# Patient Record
Sex: Female | Born: 1955 | Race: White | Hispanic: No | Marital: Single | Smoking: Former smoker
Health system: Southern US, Community
[De-identification: ages and names within clinical notes are randomized; demographics above are authoritative.]

## PROBLEM LIST (undated history)

## (undated) DIAGNOSIS — F32A Depression, unspecified: Secondary | ICD-10-CM

## (undated) DIAGNOSIS — F329 Major depressive disorder, single episode, unspecified: Secondary | ICD-10-CM

## (undated) DIAGNOSIS — M1990 Unspecified osteoarthritis, unspecified site: Secondary | ICD-10-CM

## (undated) DIAGNOSIS — F17200 Nicotine dependence, unspecified, uncomplicated: Secondary | ICD-10-CM

## (undated) DIAGNOSIS — E785 Hyperlipidemia, unspecified: Secondary | ICD-10-CM

## (undated) HISTORY — DX: Major depressive disorder, single episode, unspecified: F32.9

## (undated) HISTORY — PX: DENTAL SURGERY: SHX609

## (undated) HISTORY — DX: Depression, unspecified: F32.A

## (undated) HISTORY — DX: Unspecified osteoarthritis, unspecified site: M19.90

## (undated) HISTORY — DX: Hyperlipidemia, unspecified: E78.5

## (undated) HISTORY — PX: COLONOSCOPY: SHX174

## (undated) HISTORY — DX: Nicotine dependence, unspecified, uncomplicated: F17.200

---

## 1998-06-20 ENCOUNTER — Other Ambulatory Visit: Admission: RE | Admit: 1998-06-20 | Discharge: 1998-06-20 | Payer: Self-pay | Admitting: Obstetrics and Gynecology

## 1999-06-28 ENCOUNTER — Other Ambulatory Visit: Admission: RE | Admit: 1999-06-28 | Discharge: 1999-06-28 | Payer: Self-pay | Admitting: Obstetrics and Gynecology

## 2000-07-22 ENCOUNTER — Other Ambulatory Visit: Admission: RE | Admit: 2000-07-22 | Discharge: 2000-07-22 | Payer: Self-pay | Admitting: *Deleted

## 2002-08-10 ENCOUNTER — Other Ambulatory Visit: Admission: RE | Admit: 2002-08-10 | Discharge: 2002-08-10 | Payer: Self-pay | Admitting: Gynecology

## 2003-05-25 ENCOUNTER — Ambulatory Visit (HOSPITAL_COMMUNITY): Admission: RE | Admit: 2003-05-25 | Discharge: 2003-05-25 | Payer: Self-pay | Admitting: Internal Medicine

## 2003-08-27 ENCOUNTER — Other Ambulatory Visit: Admission: RE | Admit: 2003-08-27 | Discharge: 2003-08-27 | Payer: Self-pay | Admitting: Gynecology

## 2004-05-23 ENCOUNTER — Ambulatory Visit: Payer: Self-pay | Admitting: Internal Medicine

## 2004-06-08 ENCOUNTER — Ambulatory Visit (HOSPITAL_COMMUNITY): Admission: RE | Admit: 2004-06-08 | Discharge: 2004-06-08 | Payer: Self-pay | Admitting: Internal Medicine

## 2004-06-14 ENCOUNTER — Ambulatory Visit: Payer: Self-pay | Admitting: Internal Medicine

## 2004-07-17 ENCOUNTER — Ambulatory Visit: Payer: Self-pay | Admitting: Internal Medicine

## 2004-08-31 ENCOUNTER — Other Ambulatory Visit: Admission: RE | Admit: 2004-08-31 | Discharge: 2004-08-31 | Payer: Self-pay | Admitting: Gynecology

## 2004-09-18 ENCOUNTER — Ambulatory Visit: Payer: Self-pay | Admitting: Internal Medicine

## 2005-01-22 ENCOUNTER — Ambulatory Visit: Payer: Self-pay | Admitting: Internal Medicine

## 2005-04-12 ENCOUNTER — Ambulatory Visit: Payer: Self-pay | Admitting: Internal Medicine

## 2005-07-17 ENCOUNTER — Ambulatory Visit (HOSPITAL_COMMUNITY): Admission: RE | Admit: 2005-07-17 | Discharge: 2005-07-17 | Payer: Self-pay | Admitting: Internal Medicine

## 2005-08-03 ENCOUNTER — Ambulatory Visit: Payer: Self-pay | Admitting: Internal Medicine

## 2005-08-10 ENCOUNTER — Ambulatory Visit: Payer: Self-pay | Admitting: Internal Medicine

## 2005-08-16 ENCOUNTER — Ambulatory Visit: Payer: Self-pay | Admitting: Internal Medicine

## 2005-09-06 ENCOUNTER — Other Ambulatory Visit: Admission: RE | Admit: 2005-09-06 | Discharge: 2005-09-06 | Payer: Self-pay | Admitting: Gynecology

## 2005-12-14 ENCOUNTER — Ambulatory Visit: Payer: Self-pay | Admitting: Internal Medicine

## 2005-12-31 ENCOUNTER — Ambulatory Visit: Payer: Self-pay | Admitting: Internal Medicine

## 2006-07-31 ENCOUNTER — Ambulatory Visit: Payer: Self-pay | Admitting: Internal Medicine

## 2006-07-31 LAB — CONVERTED CEMR LAB
ALT: 13 units/L (ref 0–40)
Alkaline Phosphatase: 89 units/L (ref 39–117)
Direct LDL: 168.3 mg/dL
Eosinophils Absolute: 0.4 10*3/uL (ref 0.0–0.6)
GFR calc Af Amer: 114 mL/min
Glucose, Bld: 81 mg/dL (ref 70–99)
HCT: 40.1 % (ref 36.0–46.0)
HDL: 46.4 mg/dL (ref >39.0)
Lymphocytes Relative: 40.2 % (ref 12.0–46.0)
MCHC: 34.2 g/dL (ref 30.0–36.0)
MCV: 97 fL (ref 78.0–100.0)
Monocytes Relative: 10.9 % (ref 3.0–11.0)
Neutro Abs: 2.5 10*3/uL (ref 1.4–7.7)
Neutrophils Relative %: 42.2 % — ABNORMAL LOW (ref 43.0–77.0)
Platelets: 320 10*3/uL (ref 150–400)
Potassium: 4.3 meq/L (ref 3.5–5.1)
Total Bilirubin: 0.7 mg/dL (ref 0.3–1.2)
Total CHOL/HDL Ratio: 5
Total Protein: 7.4 g/dL (ref 6.0–8.3)
Triglycerides: 78 mg/dL (ref 0–149)
VLDL: 16 mg/dL (ref 0–40)

## 2006-08-06 ENCOUNTER — Ambulatory Visit: Payer: Self-pay | Admitting: Internal Medicine

## 2006-08-13 ENCOUNTER — Encounter: Payer: Self-pay | Admitting: Internal Medicine

## 2006-08-13 ENCOUNTER — Ambulatory Visit: Payer: Self-pay | Admitting: Internal Medicine

## 2006-09-11 ENCOUNTER — Other Ambulatory Visit: Admission: RE | Admit: 2006-09-11 | Discharge: 2006-09-11 | Payer: Self-pay | Admitting: Gynecology

## 2006-12-05 ENCOUNTER — Ambulatory Visit: Payer: Self-pay | Admitting: Internal Medicine

## 2006-12-05 DIAGNOSIS — Z872 Personal history of diseases of the skin and subcutaneous tissue: Secondary | ICD-10-CM | POA: Insufficient documentation

## 2006-12-05 DIAGNOSIS — F325 Major depressive disorder, single episode, in full remission: Secondary | ICD-10-CM | POA: Insufficient documentation

## 2006-12-05 DIAGNOSIS — F3341 Major depressive disorder, recurrent, in partial remission: Secondary | ICD-10-CM | POA: Insufficient documentation

## 2006-12-19 ENCOUNTER — Telehealth: Payer: Self-pay | Admitting: Internal Medicine

## 2007-01-01 LAB — CONVERTED CEMR LAB: Pap Smear: NORMAL

## 2007-01-07 ENCOUNTER — Telehealth: Payer: Self-pay | Admitting: Internal Medicine

## 2007-01-09 ENCOUNTER — Ambulatory Visit: Payer: Self-pay | Admitting: Internal Medicine

## 2007-01-09 DIAGNOSIS — J309 Allergic rhinitis, unspecified: Secondary | ICD-10-CM | POA: Insufficient documentation

## 2007-01-16 ENCOUNTER — Telehealth (INDEPENDENT_AMBULATORY_CARE_PROVIDER_SITE_OTHER): Payer: Self-pay | Admitting: *Deleted

## 2007-01-30 ENCOUNTER — Telehealth (INDEPENDENT_AMBULATORY_CARE_PROVIDER_SITE_OTHER): Payer: Self-pay | Admitting: *Deleted

## 2007-02-06 ENCOUNTER — Ambulatory Visit: Payer: Self-pay | Admitting: Internal Medicine

## 2007-02-06 LAB — CONVERTED CEMR LAB
Cholesterol: 216 mg/dL (ref 0–200)
Direct LDL: 170.6 mg/dL
Triglycerides: 65 mg/dL (ref 0–149)
VLDL: 13 mg/dL (ref 0–40)

## 2007-02-12 ENCOUNTER — Ambulatory Visit: Payer: Self-pay | Admitting: Internal Medicine

## 2007-02-12 DIAGNOSIS — E785 Hyperlipidemia, unspecified: Secondary | ICD-10-CM | POA: Insufficient documentation

## 2007-02-12 LAB — CONVERTED CEMR LAB
Cholesterol, target level: 200 mg/dL
HDL goal, serum: 40 mg/dL

## 2007-07-22 ENCOUNTER — Telehealth: Payer: Self-pay | Admitting: Internal Medicine

## 2007-08-21 ENCOUNTER — Ambulatory Visit (HOSPITAL_COMMUNITY): Admission: RE | Admit: 2007-08-21 | Discharge: 2007-08-21 | Payer: Self-pay | Admitting: Internal Medicine

## 2007-08-21 ENCOUNTER — Ambulatory Visit: Payer: Self-pay | Admitting: Internal Medicine

## 2007-08-21 LAB — CONVERTED CEMR LAB
Bilirubin, Direct: 0.1 mg/dL (ref 0.0–0.3)
Cholesterol: 171 mg/dL (ref 0–200)
LDL Cholesterol: 122 mg/dL — ABNORMAL HIGH (ref 0–99)
Total Bilirubin: 0.6 mg/dL (ref 0.3–1.2)
Total CHOL/HDL Ratio: 4.8
Triglycerides: 66 mg/dL (ref 0–149)

## 2007-09-25 ENCOUNTER — Other Ambulatory Visit: Admission: RE | Admit: 2007-09-25 | Discharge: 2007-09-25 | Payer: Self-pay | Admitting: Gynecology

## 2008-02-04 ENCOUNTER — Ambulatory Visit: Payer: Self-pay | Admitting: Internal Medicine

## 2008-02-04 LAB — CONVERTED CEMR LAB
AST: 19 units/L (ref 0–37)
Albumin: 4 g/dL (ref 3.5–5.2)
Alkaline Phosphatase: 78 units/L (ref 39–117)
BUN: 14 mg/dL (ref 6–23)
Cholesterol: 214 mg/dL (ref 0–200)
Eosinophils Absolute: 0.6 10*3/uL (ref 0.0–0.7)
Eosinophils Relative: 10.2 % — ABNORMAL HIGH (ref 0.0–5.0)
Glucose, Bld: 88 mg/dL (ref 70–99)
Hemoglobin: 15 g/dL (ref 12.0–15.0)
Lymphocytes Relative: 43.5 % (ref 12.0–46.0)
MCHC: 34.3 g/dL (ref 30.0–36.0)
Monocytes Absolute: 0.5 10*3/uL (ref 0.1–1.0)
Monocytes Relative: 8.6 % (ref 3.0–12.0)
Neutro Abs: 2 10*3/uL (ref 1.4–7.7)
Neutrophils Relative %: 37.3 % — ABNORMAL LOW (ref 43.0–77.0)
Platelets: 296 10*3/uL (ref 150–400)
RBC: 4.33 M/uL (ref 3.87–5.11)
TSH: 1.58 microintl units/mL (ref 0.35–5.50)
Total CHOL/HDL Ratio: 4.7
Triglycerides: 73 mg/dL (ref 0–149)
pH: 7

## 2008-02-11 ENCOUNTER — Ambulatory Visit: Payer: Self-pay | Admitting: Internal Medicine

## 2008-09-27 ENCOUNTER — Ambulatory Visit: Payer: Self-pay | Admitting: Women's Health

## 2008-09-27 ENCOUNTER — Other Ambulatory Visit: Admission: RE | Admit: 2008-09-27 | Discharge: 2008-09-27 | Payer: Self-pay | Admitting: Obstetrics and Gynecology

## 2008-09-27 ENCOUNTER — Encounter: Payer: Self-pay | Admitting: Women's Health

## 2008-09-27 ENCOUNTER — Encounter: Payer: Self-pay | Admitting: Internal Medicine

## 2008-09-30 LAB — CONVERTED CEMR LAB: Pap Smear: NORMAL

## 2008-10-25 ENCOUNTER — Emergency Department (HOSPITAL_COMMUNITY): Admission: EM | Admit: 2008-10-25 | Discharge: 2008-10-25 | Payer: Self-pay | Admitting: Family Medicine

## 2009-03-18 ENCOUNTER — Ambulatory Visit (HOSPITAL_COMMUNITY): Admission: RE | Admit: 2009-03-18 | Discharge: 2009-03-18 | Payer: Self-pay | Admitting: Internal Medicine

## 2009-04-07 ENCOUNTER — Ambulatory Visit: Payer: Self-pay | Admitting: Internal Medicine

## 2009-04-07 LAB — CONVERTED CEMR LAB
ALT: 14 units/L (ref 0–35)
BUN: 15 mg/dL (ref 6–23)
CO2: 30 meq/L (ref 19–32)
Calcium: 9.2 mg/dL (ref 8.4–10.5)
Cholesterol: 194 mg/dL (ref 0–200)
Eosinophils Absolute: 0.3 10*3/uL (ref 0.0–0.7)
Eosinophils Relative: 4.9 % (ref 0.0–5.0)
GFR calc non Af Amer: 93.5 mL/min (ref >60)
HDL: 52.4 mg/dL (ref >39.00)
Hemoglobin: 13.7 g/dL (ref 12.0–15.0)
Ketones, ur: NEGATIVE mg/dL
MCHC: 34.4 g/dL (ref 30.0–36.0)
MCV: 99.6 fL (ref 78.0–100.0)
Monocytes Relative: 8.9 % (ref 3.0–12.0)
Platelets: 255 10*3/uL (ref 150.0–400.0)
RBC: 4.02 M/uL (ref 3.87–5.11)
Specific Gravity, Urine: 1.025 (ref 1.000–1.030)
TSH: 1.7 microintl units/mL (ref 0.35–5.50)
Total CHOL/HDL Ratio: 4
Total Protein, Urine: NEGATIVE mg/dL
Triglycerides: 65 mg/dL (ref 0.0–149.0)
Urine Glucose: NEGATIVE mg/dL
Urobilinogen, UA: 0.2 (ref 0.0–1.0)
VLDL: 13 mg/dL (ref 0.0–40.0)
WBC: 6.6 10*3/uL (ref 4.5–10.5)
pH: 5.5 (ref 5.0–8.0)

## 2009-04-11 ENCOUNTER — Ambulatory Visit: Payer: Self-pay | Admitting: Internal Medicine

## 2009-08-08 ENCOUNTER — Telehealth: Payer: Self-pay | Admitting: Internal Medicine

## 2009-09-30 ENCOUNTER — Encounter: Payer: Self-pay | Admitting: Internal Medicine

## 2009-09-30 ENCOUNTER — Ambulatory Visit: Payer: Self-pay | Admitting: Women's Health

## 2009-09-30 ENCOUNTER — Other Ambulatory Visit: Admission: RE | Admit: 2009-09-30 | Discharge: 2009-09-30 | Payer: Self-pay | Admitting: Obstetrics and Gynecology

## 2009-10-28 LAB — CONVERTED CEMR LAB: Pap Smear: NORMAL

## 2010-04-10 ENCOUNTER — Other Ambulatory Visit: Payer: Self-pay | Admitting: Internal Medicine

## 2010-04-10 ENCOUNTER — Ambulatory Visit
Admission: RE | Admit: 2010-04-10 | Discharge: 2010-04-10 | Payer: Self-pay | Source: Home / Self Care | Attending: Internal Medicine | Admitting: Internal Medicine

## 2010-04-10 LAB — LIPID PANEL
Cholesterol: 209 mg/dL — ABNORMAL HIGH (ref 0–200)
HDL: 45.9 mg/dL (ref >39.00)
Total CHOL/HDL Ratio: 5
Triglycerides: 81 mg/dL (ref 0.0–149.0)
VLDL: 16.2 mg/dL (ref 0.0–40.0)

## 2010-04-10 LAB — CBC WITH DIFFERENTIAL/PLATELET
Basophils Absolute: 0 10*3/uL (ref 0.0–0.1)
Basophils Relative: 0.5 % (ref 0.0–3.0)
Eosinophils Absolute: 0.3 10*3/uL (ref 0.0–0.7)
Eosinophils Relative: 3.9 % (ref 0.0–5.0)
HCT: 42.7 % (ref 36.0–46.0)
Hemoglobin: 14.6 g/dL (ref 12.0–15.0)
Lymphocytes Relative: 39.2 % (ref 12.0–46.0)
Lymphs Abs: 2.7 10*3/uL (ref 0.7–4.0)
MCHC: 34.3 g/dL (ref 30.0–36.0)
MCV: 99.8 fl (ref 78.0–100.0)
Monocytes Absolute: 0.7 10*3/uL (ref 0.1–1.0)
Monocytes Relative: 10 % (ref 3.0–12.0)
Neutro Abs: 3.2 10*3/uL (ref 1.4–7.7)
Neutrophils Relative %: 46.4 % (ref 43.0–77.0)
Platelets: 275 10*3/uL (ref 150.0–400.0)
RBC: 4.28 Mil/uL (ref 3.87–5.11)
RDW: 14.2 % (ref 11.5–14.6)
WBC: 6.9 10*3/uL (ref 4.5–10.5)

## 2010-04-10 LAB — HEPATIC FUNCTION PANEL
ALT: 12 U/L (ref 0–35)
AST: 14 U/L (ref 0–37)
Albumin: 4 g/dL (ref 3.5–5.2)
Alkaline Phosphatase: 69 U/L (ref 39–117)
Bilirubin, Direct: 0.1 mg/dL (ref 0.0–0.3)
Total Bilirubin: 0.9 mg/dL (ref 0.3–1.2)
Total Protein: 7.2 g/dL (ref 6.0–8.3)

## 2010-04-10 LAB — BASIC METABOLIC PANEL
BUN: 15 mg/dL (ref 6–23)
CO2: 31 mEq/L (ref 19–32)
Calcium: 9.2 mg/dL (ref 8.4–10.5)
Chloride: 102 mEq/L (ref 96–112)
Creatinine, Ser: 0.6 mg/dL (ref 0.4–1.2)
GFR: 105.18 mL/min (ref >60.00)
Glucose, Bld: 81 mg/dL (ref 70–99)
Potassium: 4.4 mEq/L (ref 3.5–5.1)
Sodium: 138 mEq/L (ref 135–145)

## 2010-04-10 LAB — URINALYSIS
Bilirubin Urine: NEGATIVE
Hemoglobin, Urine: NEGATIVE
Ketones, ur: NEGATIVE
Leukocytes, UA: NEGATIVE
Nitrite: NEGATIVE
Specific Gravity, Urine: 1.02 (ref 1.000–1.030)
Total Protein, Urine: NEGATIVE
Urine Glucose: NEGATIVE
Urobilinogen, UA: 0.2 (ref 0.0–1.0)
pH: 6.5 (ref 5.0–8.0)

## 2010-04-10 LAB — LDL CHOLESTEROL, DIRECT: Direct LDL: 157.3 mg/dL

## 2010-04-10 LAB — TSH: TSH: 1.97 u[IU]/mL (ref 0.35–5.50)

## 2010-04-12 ENCOUNTER — Telehealth: Payer: Self-pay | Admitting: Internal Medicine

## 2010-04-12 ENCOUNTER — Ambulatory Visit
Admission: RE | Admit: 2010-04-12 | Discharge: 2010-04-12 | Payer: Self-pay | Source: Home / Self Care | Attending: Internal Medicine | Admitting: Internal Medicine

## 2010-04-21 ENCOUNTER — Ambulatory Visit (HOSPITAL_COMMUNITY)
Admission: RE | Admit: 2010-04-21 | Discharge: 2010-04-21 | Payer: Self-pay | Source: Home / Self Care | Attending: Internal Medicine | Admitting: Internal Medicine

## 2010-05-04 NOTE — Assessment & Plan Note (Signed)
Summary: CPX / RS   Vital Signs:  Patient profile:   55 year old female Height:      66 inches Weight:      154 pounds BMI:     24.95 Temp:     98.2 degrees F oral Pulse rate:   72 / minute Resp:     14 per minute BP sitting:   124 / 80  (left arm)  Vitals Entered By: Willy Eddy, LPN (April 11, 2009 3:20 PM) CC: cpx   CC:  cpx.  History of Present Illness: The pt was asked about all immunizations, health maint. services that are appropriate to their age and was given guidance on diet exercize  and weight management   Preventive Screening-Counseling & Management  Alcohol-Tobacco     Smoking Status: quit     Year Quit: 1998     Passive Smoke Exposure: no  Problems Prior to Update: 1)  Preventive Health Care  (ICD-V70.0) 2)  Hyperlipidemia  (ICD-272.4) 3)  Family History Diabetes 1st Degree Relative  (ICD-V18.0) 4)  Depression  (ICD-311) 5)  Allergic Rhinitis  (ICD-477.9) 6)  Contact Dermatitis, Hx of  (ICD-V13.3) 7)  Depression, Chronic  (ICD-311)  Medications Prior to Update: 1)  Cymbalta 60 Mg  Cpep (Duloxetine Hcl) .... Once Daily 2)  Niacin Flush Free 500 Mg  Caps (Inositol Niacinate) .... Once Daily 3)  Pravastatin Sodium 40 Mg Tabs (Pravastatin Sodium) .... One By Mouth Q Hs 4)  Loratadine 10 Mg Tabs (Loratadine) .... One By Mouth Daily  Current Medications (verified): 1)  Cymbalta 60 Mg  Cpep (Duloxetine Hcl) .... Once Daily 2)  Pravastatin Sodium 40 Mg Tabs (Pravastatin Sodium) .... One By Mouth Q Hs  Allergies (verified): No Known Drug Allergies  Past History:  Family History: Last updated: 01/09/2007 mother has parkinson's father alive and well Family History Diabetes 1st degree relative family hs of depression  Social History: Last updated: 01/09/2007 Single Former Smoker  Risk Factors: Caffeine Use: 1 (02/12/2007) Exercise: no (02/12/2007)  Risk Factors: Smoking Status: quit (04/11/2009) Passive Smoke Exposure: no  (04/11/2009)  Past medical, surgical, family and social histories (including risk factors) reviewed, and no changes noted (except as noted below).  Past Medical History: Reviewed history from 02/12/2007 and no changes required. Allergic rhinitis Depression Hyperlipidemia  Past Surgical History: Reviewed history from 01/09/2007 and no changes required. Denies surgical history  Family History: Reviewed history from 01/09/2007 and no changes required. mother has parkinson's father alive and well Family History Diabetes 1st degree relative family hs of depression  Social History: Reviewed history from 01/09/2007 and no changes required. Single Former Smoker  Review of Systems  The patient denies anorexia, fever, weight loss, weight gain, vision loss, decreased hearing, hoarseness, chest pain, syncope, dyspnea on exertion, peripheral edema, prolonged cough, headaches, hemoptysis, abdominal pain, melena, hematochezia, severe indigestion/heartburn, hematuria, incontinence, genital sores, muscle weakness, suspicious skin lesions, transient blindness, difficulty walking, depression, unusual weight change, abnormal bleeding, enlarged lymph nodes, angioedema, breast masses, and testicular masses.         Flu Vaccine Consent Questions     Do you have a history of severe allergic reactions to this vaccine? no    Any prior history of allergic reactions to egg and/or gelatin? no    Do you have a sensitivity to the preservative Thimersol? no    Do you have a past history of Guillan-Barre Syndrome? no    Do you currently have an acute febrile illness? no  Have you ever had a severe reaction to latex? no    Vaccine information given and explained to patient? yes    Are you currently pregnant? no    Lot Number:[id number]   Exp Date:09/29/2009   Site Given  Left Deltoid IM   Physical Exam  General:  alert.  well-developed.   Head:  normocephalic and atraumatic.   Eyes:  pupils equal and  pupils round.   Ears:  R ear normal and L ear normal.   Nose:  External nasal examination shows no deformity or inflammation. Nasal mucosa are pink and moist without lesions or exudates. Mouth:  pharynx pink and moist.   Neck:  No deformities, masses, or tenderness noted. Lungs:  Normal respiratory effort, chest expands symmetrically. Lungs are clear to auscultation, no crackles or wheezes. Heart:  Normal rate and regular rhythm. S1 and S2 normal without gallop, murmur, click, rub or other extra sounds. Abdomen:  soft and normal bowel sounds.   Msk:  No deformity or scoliosis noted of thoracic or lumbar spine.   Pulses:  R and L carotid,radial,femoral,dorsalis pedis and posterior tibial pulses are full and equal bilaterally Extremities:  No clubbing, cyanosis, edema, or deformity noted with normal full range of motion of all joints.   Neurologic:  alert & oriented X3, gait normal, and DTRs symmetrical and normal.     Impression & Recommendations:  Problem # 1:  PREVENTIVE HEALTH CARE (ICD-V70.0) The pt was asked about all immunizations, health maint. services that are appropriate to their age and was given guidance on diet exercize  and weight management  Mammogram: ASSESSMENT: Negative - BI-RADS 1^MM DIGITAL SCREENING (03/18/2009) Pap smear: normal (09/30/2008) Colonoscopy: normal (11/30/2003) Bone Density: normal (08/13/2006) Td Booster: Historical (04/02/2005)   Flu Vax: Fluvax 3+ (04/11/2009)   Chol: 194 (04/07/2009)   HDL: 52.40 (04/07/2009)   LDL: 129 (04/07/2009)   TG: 65.0 (04/07/2009) TSH: 1.70 (04/07/2009)   Next mammogram due:: 04/2010 (04/11/2009) Next Colonoscopy due:: 12/2010 (02/11/2008) Next Bone Density due:: 07/2008 (02/11/2008)  Discussed using sunscreen, use of alcohol, drug use, self breast exam, routine dental care, routine eye care, schedule for GYN exam, routine physical exam, seat belts, multiple vitamins, osteoporosis prevention, adequate calcium intake in diet,  recommendations for immunizations, mammograms and Pap smears.  Discussed exercise and checking cholesterol.  Discussed gun safety, safe sex, and contraception.  Complete Medication List: 1)  Cymbalta 60 Mg Cpep (Duloxetine hcl) .... Once daily 2)  Pravastatin Sodium 40 Mg Tabs (Pravastatin sodium) .... One by mouth q hs  Other Orders: Admin 1st Vaccine ([address]) Flu Vaccine 34yrs + ([address])  Patient Instructions: 1)  sarna itch lotion and eucerine lotion 50/50 2)  eucerin calming lotion 3)  Please schedule a follow-up appointment in 6 months.   Preventive Care Screening  Mammogram:    Date:  09/30/2008    Next Due:  04/2010    Results:  normal   Pap Smear:    Date:  09/30/2008    Next Due:  09/2009    Results:  normal

## 2010-05-04 NOTE — Progress Notes (Signed)
Summary: refill request  Phone Note Refill Request Message from:  Patient  Refills Requested: Medication #1:  PRAVASTATIN SODIUM 40 MG TABS one by mouth daily. Initial call taken by: Georgian Co,  April 12, 2010 11:29 AM    Prescriptions: PRAVASTATIN SODIUM 40 MG TABS (PRAVASTATIN SODIUM) one by mouth daily  #30 x 11   Entered by:   Willy Eddy, LPN   Authorized by:   Stacie Glaze MD   Signed by:   Willy Eddy, LPN on 04/54/0981   Method used:   Electronically to        [facility]. [phone]* (retail)       [address]       [place], [place]  [address]       Ph: [phone]       Fax: [phone]   RxID:   [id number]

## 2010-05-04 NOTE — Progress Notes (Signed)
Summary: REQ FOR LABWORK / APPT.  Phone Note Call from Patient Call back at Work Phone [phone]   Caller: Patient  [phone] Reason for Call: Talk to Nurse, Talk to Doctor Summary of Call: Pt called to req that labwork be performed prior to her 6 mth f/u appt with Dr Lovell Sheehan in July 2011..... Can you advise order for labwork so (if needed) appt can be scheduled one wk prior to pts appt with Dr Lovell Sheehan?   Initial call taken by: Debbra Riding,  Aug 08, 2009 3:16 PM  Follow-up for Phone Call        she can have a fasting lipid and liver 1q week prior 272.4 Follow-up by: Willy Eddy, LPN,  Aug 09, 1882 3:24 PM  Additional Follow-up for Phone Call Additional follow up Details #1::        Attempted to make contact w/ pt... LMTCB so that lab appt can be scheduled for fasting lipid and liver panels 272.4...Marland KitchenMarland Kitchen one wk prior to appt with Dr Lovell Sheehan on 10/10/2009 @ 8am.  Additional Follow-up by: Debbra Riding,  Aug 08, 2009 3:37 PM

## 2010-05-04 NOTE — Assessment & Plan Note (Signed)
Summary: CPX/CJR   Vital Signs:  Patient profile:   54 year old female Height:      66 inches Weight:      144 pounds BMI:     23.33 Temp:     98.2 degrees F oral Pulse rate:   72 / minute Resp:     14 per minute BP sitting:   110 / 80  (left arm)  Vitals Entered By: Willy Eddy, LPN (April 12, 2010 10:56 AM) CC: cpx--pt stopped cumbalta and pravastatin by self, Depressive symptoms Is Patient Diabetic? No   Primary Care Provider:  Stacie Glaze MD  CC:  cpx--pt stopped cumbalta and pravastatin by self and Depressive symptoms.  History of Present Illness: The pt was asked about all immunizations, health maint. services that are appropriate to their age and was given guidance on diet exercize  and weight management   Hyperlipidemia Follow-Up      This is a 55 year old woman who presents for Hyperlipidemia follow-up.  The patient denies muscle aches, GI upset, abdominal pain, flushing, itching, constipation, diarrhea, and fatigue.  The patient denies the following symptoms: chest pain/pressure, exercise intolerance, dypsnea, palpitations, syncope, and pedal edema.  Compliance with medications (by patient report) has been near 100%.  Dietary compliance has been fair.  The patient reports exercising occasionally.  Adjunctive measures currently used by the patient include fish oil supplements.    Depressive Symptoms      The patient also presents with Depressive symptoms.  The patient reports depressed mood, but denies loss of interest/pleasure, significant weight loss, significant weight gain, insomnia, hypersomnia, psychomotor agitation, and psychomotor retardation.  The patient also reports indecisiveness.  The patient reports the following psychosocial stressors: major life changes.  The patient reports the following manic symptoms: distractibility.    Preventive Screening-Counseling & Management  Alcohol-Tobacco     Smoking Status: quit     Year Quit: 1998     Passive  Smoke Exposure: no  Problems Prior to Update: 1)  Preventive Health Care  (ICD-V70.0) 2)  Hyperlipidemia  (ICD-272.4) 3)  Family History Diabetes 1st Degree Relative  (ICD-V18.0) 4)  Depression  (ICD-311) 5)  Allergic Rhinitis  (ICD-477.9) 6)  Contact Dermatitis, Hx of  (ICD-V13.3) 7)  Depression, Chronic  (ICD-311)  Current Problems (verified): 1)  Preventive Health Care  (ICD-V70.0) 2)  Hyperlipidemia  (ICD-272.4) 3)  Family History Diabetes 1st Degree Relative  (ICD-V18.0) 4)  Depression  (ICD-311) 5)  Allergic Rhinitis  (ICD-477.9) 6)  Contact Dermatitis, Hx of  (ICD-V13.3) 7)  Depression, Chronic  (ICD-311)  Medications Prior to Update: 1)  Cymbalta 60 Mg  Cpep (Duloxetine Hcl) .... Once Daily 2)  Pravastatin Sodium 40 Mg Tabs (Pravastatin Sodium) .... One By Mouth Q Hs  Current Medications (verified): 1)  Pravastatin Sodium 40 Mg Tabs (Pravastatin Sodium) .... One By Mouth Daily  Allergies (verified): No Known Drug Allergies  Past History:  Family History: Last updated: 01/09/2007 mother has parkinson's father alive and well Family History Diabetes 1st degree relative family hs of depression  Social History: Last updated: 01/09/2007 Single Former Smoker  Risk Factors: Caffeine Use: 1 (02/12/2007) Exercise: no (02/12/2007)  Risk Factors: Smoking Status: quit (04/12/2010) Passive Smoke Exposure: no (04/12/2010)  Past medical, surgical, family and social histories (including risk factors) reviewed, and no changes noted (except as noted below).  Past Medical History: Reviewed history from 02/12/2007 and no changes required. Allergic rhinitis Depression Hyperlipidemia  Past Surgical History: Reviewed history  from 01/09/2007 and no changes required. Denies surgical history  Family History: Reviewed history from 01/09/2007 and no changes required. mother has parkinson's father alive and well Family History Diabetes 1st degree relative family hs of  depression  Social History: Reviewed history from 01/09/2007 and no changes required. Single Former Smoker  Review of Systems  The patient denies anorexia, fever, weight loss, weight gain, vision loss, decreased hearing, hoarseness, chest pain, syncope, dyspnea on exertion, peripheral edema, prolonged cough, headaches, hemoptysis, abdominal pain, melena, hematochezia, severe indigestion/heartburn, hematuria, incontinence, genital sores, muscle weakness, suspicious skin lesions, transient blindness, difficulty walking, depression, unusual weight change, abnormal bleeding, enlarged lymph nodes, angioedema, and breast masses.    Physical Exam  General:  alert.  well-developed.   Head:  normocephalic and atraumatic.   Eyes:  pupils equal and pupils round.   Ears:  R ear normal and L ear normal.   Nose:  External nasal examination shows no deformity or inflammation. Nasal mucosa are pink and moist without lesions or exudates. Mouth:  pharynx pink and moist.   Neck:  No deformities, masses, or tenderness noted. Lungs:  Normal respiratory effort, chest expands symmetrically. Lungs are clear to auscultation, no crackles or wheezes. Heart:  Normal rate and regular rhythm. S1 and S2 normal without gallop, murmur, click, rub or other extra sounds. Abdomen:  soft and normal bowel sounds.   Msk:  No deformity or scoliosis noted of thoracic or lumbar spine.   Extremities:  No clubbing, cyanosis, edema, or deformity noted with normal full range of motion of all joints.   Neurologic:  alert & oriented X3, gait normal, and DTRs symmetrical and normal.     Impression & Recommendations:  Problem # 1:  HYPERLIPIDEMIA (ICD-272.4)  had stopped the pravastatin and needs to resume with marked ncreased in LDL with risks The following medications were removed from the medication list:    Pravastatin Sodium 40 Mg Tabs (Pravastatin sodium) ..... One by mouth q hs Her updated medication list for this problem  includes:    Pravastatin Sodium 40 Mg Tabs (Pravastatin sodium) ..... One by mouth daily  Labs Reviewed: SGOT: 21 (04/07/2009)   SGPT: 14 (04/07/2009)  Lipid Goals: Chol Goal: 200 (02/12/2007)   HDL Goal: 40 (02/12/2007)   LDL Goal: 160 (02/12/2007)   TG Goal: 150 (02/12/2007)  Prior 10 Yr Risk Heart Disease: Not enough information (02/12/2007)   HDL:52.40 (04/07/2009), 46.0 (02/04/2008)  LDL:129 (04/07/2009), DEL (02/04/2008)  Chol:194 (04/07/2009), 214 (02/04/2008)  Trig:65.0 (04/07/2009), 73 (02/04/2008)  Problem # 2:  DEPRESSION (ICD-311) Assessment: Unchanged stapped the medications The following medications were removed from the medication list:    Cymbalta 60 Mg Cpep (Duloxetine hcl) ..... Once daily  Discussed treatment options, including trial of antidpressant medication. Will refer to behavioral health. Follow-up call in in 24-48 hours and recheck in 2 weeks, sooner as needed. Patient agrees to call if any worsening of symptoms or thoughts of doing harm arise. Verified that the patient has no suicidal ideation at this time.   Problem # 3:  PREVENTIVE HEALTH CARE (ICD-V70.0) The pt was asked about all immunizations, health maint. services that are appropriate to their age and was given guidance on diet exercize  and weight management  Mammogram: normal (10/28/2009) Pap smear: normal (10/28/2009) Colonoscopy: normal (11/30/2003) Bone Density: normal (08/13/2006) Td Booster: Historical (04/02/2005)   Flu Vax: Fluvax 3+ (04/11/2009)   Chol: 194 (04/07/2009)   HDL: 52.40 (04/07/2009)   LDL: 129 (04/07/2009)   TG: 65.0 (  04/07/2009) TSH: 1.70 (04/07/2009)   Next mammogram due:: 08/2010 (04/12/2010) Next Colonoscopy due:: 12/2010 (02/11/2008) Next Bone Density due:: 07/2008 (02/11/2008)  Discussed using sunscreen, use of alcohol, drug use, self breast exam, routine dental care, routine eye care, schedule for GYN exam, routine physical exam, seat belts, multiple vitamins,  osteoporosis prevention, adequate calcium intake in diet, recommendations for immunizations, mammograms and Pap smears.  Discussed exercise and checking cholesterol.  Discussed gun safety, safe sex, and contraception.  Complete Medication List: 1)  Pravastatin Sodium 40 Mg Tabs (Pravastatin sodium) .... One by mouth daily  Patient Instructions: 1)  Please schedule a follow-up appointment in 4 months. 2)  Hepatic Panel prior to visit, ICD-9:995.20 3)  Lipid Panel prior to visit, ICD-9:272.4 4)  need to resume the pravachol Prescriptions: PRAVASTATIN SODIUM 40 MG TABS (PRAVASTATIN SODIUM) one by mouth daily  #30 x 11   Entered and Authorized by:   Stacie Glaze MD   Signed by:   Stacie Glaze MD on 04/12/2010   Method used:   Electronically to        [facility]. [phone]* (retail)       [address]       [place], [place]  [address]       Ph: [phone]       Fax: [phone]   RxID:   [id number]    Orders Added: 1)  Est. Patient 40-64 years [99396] 2)  Est. Patient Level III [44010]     Preventive Care Screening  Mammogram:    Date:  10/28/2009    Next Due:  08/2010    Results:  normal   Pap Smear:    Date:  10/28/2009    Next Due:  11/2010    Results:  normal

## 2010-12-11 ENCOUNTER — Encounter: Payer: Self-pay | Admitting: Internal Medicine

## 2011-05-25 ENCOUNTER — Other Ambulatory Visit: Payer: Self-pay | Admitting: Internal Medicine

## 2011-05-25 DIAGNOSIS — Z1231 Encounter for screening mammogram for malignant neoplasm of breast: Secondary | ICD-10-CM

## 2011-06-01 ENCOUNTER — Encounter: Payer: Self-pay | Admitting: Women's Health

## 2011-06-01 ENCOUNTER — Ambulatory Visit (INDEPENDENT_AMBULATORY_CARE_PROVIDER_SITE_OTHER): Payer: BC Managed Care – PPO | Admitting: Women's Health

## 2011-06-01 ENCOUNTER — Other Ambulatory Visit (HOSPITAL_COMMUNITY)
Admission: RE | Admit: 2011-06-01 | Discharge: 2011-06-01 | Disposition: A | Discharge status: Home / Self Care | Payer: BC Managed Care – PPO | Source: Ambulatory Visit | Attending: Obstetrics and Gynecology | Admitting: Obstetrics and Gynecology

## 2011-06-01 VITALS — BP 102/74 | Ht 66.5 in | Wt 135.0 lb

## 2011-06-01 DIAGNOSIS — Z01419 Encounter for gynecological examination (general) (routine) without abnormal findings: Secondary | ICD-10-CM

## 2011-06-01 NOTE — Progress Notes (Signed)
Monica Hood January 10, 1956 [id number]    History:    The patient presents for annual exam.  Postmenopausal with no bleeding or HRT. History of normal Paps and mammograms. Not sexually active. Has had some problems with a recurrent scattered, patchy, itchy trunk rash, has seen a dermatologist.   Past medical history, past surgical history, family history and social history were all reviewed and documented in the EPIC chart. Laid off from the [employer]. Labs at primary care. Negative colonoscopy in 2005   ROS:  A  ROS was performed and pertinent positives and negatives are included in the history.  Exam:  Filed Vitals:   06/01/11 0949  BP: 102/74    General appearance:  Normal Head/Neck:  Normal, without cervical or supraclavicular adenopathy. Thyroid:  Symmetrical, normal in size, without palpable masses or nodularity. Respiratory  Effort:  Normal  Auscultation:  Clear without wheezing or rhonchi Cardiovascular  Auscultation:  Regular rate, without rubs, murmurs or gallops  Edema/varicosities:  Not grossly evident Abdominal  Soft,nontender, without masses, guarding or rebound.  Liver/spleen:  No organomegaly noted  Hernia:  None appreciated  Skin  Inspection:  Grossly normal/small red papules on chest and back  Palpation:  Grossly normal Neurologic/psychiatric  Orientation:  Normal with appropriate conversation.  Mood/affect:  Normal  Genitourinary    Breasts: Examined lying and sitting.     Right: Without masses, retractions, discharge or axillary adenopathy.     Left: Without masses, retractions, discharge or axillary adenopathy.   Inguinal/mons:  Normal without inguinal adenopathy  External genitalia:  Normal  BUS/Urethra/Skene's glands:  Normal  Bladder:  Normal  Vagina:  Normal  Cervix:  Normal  Uterus:  normal in size, shape and contour.  Midline and mobile  Adnexa/parametria:     Rt: Without masses or tenderness.   Lt: Without masses or  tenderness.  Anus and perineum: Normal  Digital rectal exam: Normal sphincter tone without palpated masses or tenderness  Assessment/Plan:  56 y.o. SWF G1 P0 for annual exam.   Postmenopausal on no HRT Depression-primary care meds and labs.  Plan: SBE's, annual mammogram, calcium rich diet, vitamin D 2000 daily encouraged. Declines bone density screening. Home safety and fall prevention discussed. Condoms and lubricants encouraged if becomes sexually active. Pap   Harrington Challenger WHNP, 1:46 PM 06/01/2011

## 2011-06-01 NOTE — Patient Instructions (Signed)
VitD 2000 daily Dexa  Health Maintenance, Females A healthy lifestyle and preventative care can promote health and wellness.  Maintain regular health, dental, and eye exams.   Eat a healthy diet. Foods like vegetables, fruits, whole grains, low-fat dairy products, and lean protein foods contain the nutrients you need without too many calories. Decrease your intake of foods high in solid fats, added sugars, and salt. Get information about a proper diet from your caregiver, if necessary.   Regular physical exercise is one of the most important things you can do for your health. Most adults should get at least 150 minutes of moderate-intensity exercise (any activity that increases your heart rate and causes you to sweat) each week. In addition, most adults need muscle-strengthening exercises on 2 or more days a week.    Maintain a healthy weight. The body mass index (BMI) is a screening tool to identify possible weight problems. It provides an estimate of body fat based on height and weight. Your caregiver can help determine your BMI, and can help you achieve or maintain a healthy weight. For adults 20 years and older:   A BMI below 18.5 is considered underweight.   A BMI of 18.5 to 24.9 is normal.   A BMI of 25 to 29.9 is considered overweight.   A BMI of 30 and above is considered obese.   Maintain normal blood lipids and cholesterol by exercising and minimizing your intake of saturated fat. Eat a balanced diet with plenty of fruits and vegetables. Blood tests for lipids and cholesterol should begin at age 70 and be repeated every 5 years. If your lipid or cholesterol levels are high, you are over 50, or you are a high risk for heart disease, you may need your cholesterol levels checked more frequently.Ongoing high lipid and cholesterol levels should be treated with medicines if diet and exercise are not effective.   If you smoke, find out from your caregiver how to quit. If you do not use  tobacco, do not start.   If you are pregnant, do not drink alcohol. If you are breastfeeding, be very cautious about drinking alcohol. If you are not pregnant and choose to drink alcohol, do not exceed 1 drink per day. One drink is considered to be 12 ounces (355 mL) of beer, 5 ounces (148 mL) of wine, or 1.5 ounces (44 mL) of liquor.   Avoid use of street drugs. Do not share needles with anyone. Ask for help if you need support or instructions about stopping the use of drugs.   High blood pressure causes heart disease and increases the risk of stroke. Blood pressure should be checked at least every 1 to 2 years. Ongoing high blood pressure should be treated with medicines, if weight loss and exercise are not effective.   If you are 53 to 54 years old, ask your caregiver if you should take aspirin to prevent strokes.   Diabetes screening involves taking a blood sample to check your fasting blood sugar level. This should be done once every 3 years, after age 36, if you are within normal weight and without risk factors for diabetes. Testing should be considered at a younger age or be carried out more frequently if you are overweight and have at least 1 risk factor for diabetes.   Breast cancer screening is essential preventative care for women. You should practice "breast self-awareness." This means understanding the normal appearance and feel of your breasts and may include breast self-examination. Any  changes detected, no matter how small, should be reported to a caregiver. Women in their 30s and 30s should have a clinical breast exam (CBE) by a caregiver as part of a regular health exam every 1 to 3 years. After age 53, women should have a CBE every year. Starting at age 23, women should consider having a mammogram (breast X-ray) every year. Women who have a family history of breast cancer should talk to their caregiver about genetic screening. Women at a high risk of breast cancer should talk to their  caregiver about having an MRI and a mammogram every year.   The Pap test is a screening test for cervical cancer. Women should have a Pap test starting at age 31. Between ages 31 and 63, Pap tests should be repeated every 2 years. Beginning at age 51, you should have a Pap test every 3 years as long as the past 3 Pap tests have been normal. If you had a hysterectomy for a problem that was not cancer or a condition that could lead to cancer, then you no longer need Pap tests. If you are between ages 10 and 13, and you have had normal Pap tests going back 10 years, you no longer need Pap tests. If you have had past treatment for cervical cancer or a condition that could lead to cancer, you need Pap tests and screening for cancer for at least 20 years after your treatment. If Pap tests have been discontinued, risk factors (such as a new sexual partner) need to be reassessed to determine if screening should be resumed. Some women have medical problems that increase the chance of getting cervical cancer. In these cases, your caregiver may recommend more frequent screening and Pap tests.   The human papillomavirus (HPV) test is an additional test that may be used for cervical cancer screening. The HPV test looks for the virus that can cause the cell changes on the cervix. The cells collected during the Pap test can be tested for HPV. The HPV test could be used to screen women aged 34 years and older, and should be used in women of any age who have unclear Pap test results. After the age of 76, women should have HPV testing at the same frequency as a Pap test.   Colorectal cancer can be detected and often prevented. Most routine colorectal cancer screening begins at the age of 9 and continues through age 72. However, your caregiver may recommend screening at an earlier age if you have risk factors for colon cancer. On a yearly basis, your caregiver may provide home test kits to check for hidden blood in the stool.  Use of a small camera at the end of a tube, to directly examine the colon (sigmoidoscopy or colonoscopy), can detect the earliest forms of colorectal cancer. Talk to your caregiver about this at age 44, when routine screening begins. Direct examination of the colon should be repeated every 5 to 10 years through age 64, unless early forms of pre-cancerous polyps or small growths are found.   Practice safe sex. Use condoms and avoid high-risk sexual practices to reduce the spread of sexually transmitted infections (STIs). Sexually active women aged 60 and younger should be checked for Chlamydia, which is a common sexually transmitted infection. Older women with new or multiple partners should also be tested for Chlamydia. Testing for other STIs is recommended if you are sexually active and at increased risk.   Osteoporosis is a disease in which  the bones lose minerals and strength with aging. This can result in serious bone fractures. The risk of osteoporosis can be identified using a bone density scan. Women ages 2 and over and women at risk for fractures or osteoporosis should discuss screening with their caregivers. Ask your caregiver whether you should be taking a calcium supplement or vitamin D to reduce the rate of osteoporosis.   Menopause can be associated with physical symptoms and risks. Hormone replacement therapy is available to decrease symptoms and risks. You should talk to your caregiver about whether hormone replacement therapy is right for you.   Use sunscreen with a sun protection factor (SPF) of 30 or greater. Apply sunscreen liberally and repeatedly throughout the day. You should seek shade when your shadow is shorter than you. Protect yourself by wearing long sleeves, pants, a wide-brimmed hat, and sunglasses year round, whenever you are outdoors.   Notify your caregiver of new moles or changes in moles, especially if there is a change in shape or color. Also notify your caregiver if a  mole is larger than the size of a pencil eraser.   Stay current with your immunizations.  Document Released: 10/02/2010 Document Revised: 11/29/2010 Document Reviewed: 10/02/2010 [facility] Patient Information 2012 [place], [place].

## 2011-06-18 ENCOUNTER — Ambulatory Visit (HOSPITAL_COMMUNITY)
Admission: RE | Admit: 2011-06-18 | Discharge: 2011-06-18 | Disposition: A | Discharge status: Home / Self Care | Payer: BC Managed Care – PPO | Source: Ambulatory Visit | Attending: Internal Medicine | Admitting: Internal Medicine

## 2011-06-18 DIAGNOSIS — Z1231 Encounter for screening mammogram for malignant neoplasm of breast: Secondary | ICD-10-CM | POA: Insufficient documentation

## 2011-07-05 ENCOUNTER — Other Ambulatory Visit: Payer: Self-pay

## 2011-07-06 ENCOUNTER — Other Ambulatory Visit (INDEPENDENT_AMBULATORY_CARE_PROVIDER_SITE_OTHER): Payer: BC Managed Care – PPO

## 2011-07-06 DIAGNOSIS — Z0000 Encounter for general adult medical examination without abnormal findings: Secondary | ICD-10-CM

## 2011-07-06 LAB — POCT URINALYSIS DIPSTICK
Ketones, UA: NEGATIVE
Protein, UA: NEGATIVE

## 2011-07-06 LAB — BASIC METABOLIC PANEL
CO2: 28 mEq/L (ref 19–32)
Calcium: 9.3 mg/dL (ref 8.4–10.5)
Chloride: 101 mEq/L (ref 96–112)
Creatinine, Ser: 0.7 mg/dL (ref 0.4–1.2)
Potassium: 4.1 mEq/L (ref 3.5–5.1)

## 2011-07-06 LAB — CBC WITH DIFFERENTIAL/PLATELET
Eosinophils Absolute: 0.5 10*3/uL (ref 0.0–0.7)
HCT: 41.9 % (ref 36.0–46.0)
Lymphocytes Relative: 34.7 % (ref 12.0–46.0)
Lymphs Abs: 2.6 10*3/uL (ref 0.7–4.0)
Monocytes Absolute: 0.5 10*3/uL (ref 0.1–1.0)
Neutrophils Relative %: 51.7 % (ref 43.0–77.0)
Platelets: 248 10*3/uL (ref 150.0–400.0)
RBC: 4.21 Mil/uL (ref 3.87–5.11)

## 2011-07-06 LAB — HEPATIC FUNCTION PANEL
ALT: 12 U/L (ref 0–35)
AST: 17 U/L (ref 0–37)
Albumin: 4.3 g/dL (ref 3.5–5.2)
Bilirubin, Direct: 0 mg/dL (ref 0.0–0.3)
Total Bilirubin: 0.6 mg/dL (ref 0.3–1.2)

## 2011-07-06 LAB — LIPID PANEL
Total CHOL/HDL Ratio: 4
Triglycerides: 83 mg/dL (ref 0.0–149.0)

## 2011-07-06 LAB — LDL CHOLESTEROL, DIRECT: Direct LDL: 153.1 mg/dL

## 2011-07-06 LAB — TSH: TSH: 1.28 u[IU]/mL (ref 0.35–5.50)

## 2011-07-11 ENCOUNTER — Ambulatory Visit (INDEPENDENT_AMBULATORY_CARE_PROVIDER_SITE_OTHER): Payer: BC Managed Care – PPO | Admitting: Internal Medicine

## 2011-07-11 ENCOUNTER — Encounter: Payer: Self-pay | Admitting: Internal Medicine

## 2011-07-11 VITALS — BP 100/60 | HR 64 | Temp 98.2°F | Resp 14 | Ht 66.5 in | Wt 134.0 lb

## 2011-07-11 DIAGNOSIS — Z136 Encounter for screening for cardiovascular disorders: Secondary | ICD-10-CM

## 2011-07-11 DIAGNOSIS — Z0000 Encounter for general adult medical examination without abnormal findings: Secondary | ICD-10-CM

## 2011-07-11 DIAGNOSIS — K2100 Gastro-esophageal reflux disease with esophagitis, without bleeding: Secondary | ICD-10-CM

## 2011-07-11 NOTE — Patient Instructions (Addendum)
The patient is instructed to continue all medications as prescribed. Schedule followup with check out clerk upon leaving the clinic I recommend that you take Prilosec 20 mg before bed for the next 2 weeks and see if the morning swallowing issues resolve if they do continue the Prilosec for at least 6 weeks for complete healing.  If the symptoms do not change contact my office for referral to gastroenterology.  I also recommend that you take a omega-3 supplement such as KRIL oil 1000 mg twice a day

## 2011-07-11 NOTE — Progress Notes (Signed)
Subjective:    Patient ID: Monica Hood, female    DOB: February 17, 1956, 56 y.o.   MRN: [id number]  HPI The patient is a 56 year old female who presents for complete physical examination her blood pressure stable she is on no current medications his is history of mild to moderate lipid elevation she also has a history of mild gastroesophageal reflux she has a acute complaint of some difficulty swallowing especially in the morning after she's been laying down all night she does not recall any heartburn but does have some "mild indigestion"   Review of Systems  Constitutional: Negative for activity change, appetite change and fatigue.  HENT: Negative for ear pain, congestion, neck pain, postnasal drip and sinus pressure.   Eyes: Negative for redness and visual disturbance.  Respiratory: Negative for cough, shortness of breath and wheezing.   Gastrointestinal: Negative for abdominal pain and abdominal distention.  Genitourinary: Negative for dysuria, frequency and menstrual problem.  Musculoskeletal: Negative for myalgias, joint swelling and arthralgias.  Skin: Negative for rash and wound.  Neurological: Negative for dizziness, weakness and headaches.  Hematological: Negative for adenopathy. Does not bruise/bleed easily.  Psychiatric/Behavioral: Negative for sleep disturbance and decreased concentration.   T    Past Medical History  Diagnosis Date  . Smoker     1/2 pack per week  . Hypercholesteremia     History   Social History  . Marital Status: Single    Spouse Name: N/A    Number of Children: N/A  . Years of Education: N/A   Occupational History  . Not on file.   Social History Main Topics  . Smoking status: Former Games developer  . Smokeless tobacco: Never Used  . Alcohol Use: 1.8 oz/week    3 Glasses of wine per week  . Drug Use: No  . Sexually Active: No   Other Topics Concern  . Not on file   Social History Narrative  . No narrative on file    No past surgical history  on file.  Family History  Problem Relation Age of Onset  . Breast cancer Mother   . Parkinsonism Mother 2    DIED W [facility]ARK[facility]S  . Hypertension Father   . Heart disease Father   . Diabetes Brother     No Known Allergies  No current outpatient prescriptions on file prior to visit.    BP 100/60  Pulse 64  Temp 98.2 F (36.8 C)  Resp 14  Ht 5' 6.5" (1.689 m)  Wt 134 lb (60.782 kg)  BMI 21.30 kg/m2    Objective:   Physical Exam  Nursing note and vitals reviewed. Constitutional: She is oriented to person, place, and time. She appears well-developed and well-nourished. No distress.  HENT:  Head: Normocephalic and atraumatic.  Right Ear: External ear normal.  Left Ear: External ear normal.  Nose: Nose normal.  Mouth/Throat: Oropharynx is clear and moist.  Eyes: Conjunctivae and EOM are normal. Pupils are equal, round, and reactive to light.  Neck: Normal range of motion. Neck supple. No JVD present. No tracheal deviation present. No thyromegaly present.  Cardiovascular: Normal rate, regular rhythm, normal heart sounds and intact distal pulses.   No murmur heard. Pulmonary/Chest: Effort normal and breath sounds normal. She has no wheezes. She exhibits no tenderness.  Abdominal: Soft. Bowel sounds are normal.  Musculoskeletal: Normal range of motion. She exhibits no edema and no tenderness.  Lymphadenopathy:    She has no cervical adenopathy.  Neurological: She is alert and oriented  to person, place, and time. She has normal reflexes. No cranial nerve deficit.  Skin: Skin is warm and dry. She is not diaphoretic.  Psychiatric: She has a normal mood and affect. Her behavior is normal.          Assessment & Plan:   his is a routine physical examination for this healthy  Female. Reviewed all health maintenance protocols including mammography colonoscopy bone density and reviewed appropriate screening labs. Her immunization history was reviewed as well as her current  medications and allergies refills of her chronic medications were given and the plan for yearly health maintenance was discussed all orders and referrals were made as appropriate.  Patient had some nocturnal reflux the rest of the 12 Prilosec 20 mg by mouth q. at bedtime and see if her symptoms change.  Her symptoms did not change she should be referred to cholesterol given consideration for EGD.  She also is recommended to take omega-3 supplement such as krill oil.

## 2011-11-02 ENCOUNTER — Telehealth: Payer: Self-pay | Admitting: *Deleted

## 2011-11-02 NOTE — Telephone Encounter (Signed)
Pt is calling requesting cymbalta 60 mg tablets with refills. Pt said you have written rx before it was sometime ago, she is taking counseling with Chipper Oman and she recommended but use this. Call back # [phone] if you need to speak with pt. Please advise

## 2011-11-02 NOTE — Telephone Encounter (Signed)
Telephone call, has used Cymbalta in the past, has received Cymbalta 30 mg months sample and needs a prescription for 60 mg 90 day supply. Will continue with counseling. Once of harming self. Has been on employed had worked for [employer] and is having difficulty finding a job. Reviewed importance of exercise. Request to pick up a written prescription so can  get a discounted prescription.

## 2011-11-02 NOTE — Telephone Encounter (Signed)
Message left

## 2011-12-20 ENCOUNTER — Encounter: Payer: Self-pay | Admitting: Internal Medicine

## 2012-09-12 ENCOUNTER — Other Ambulatory Visit: Payer: Self-pay | Admitting: Women's Health

## 2012-09-12 DIAGNOSIS — Z1231 Encounter for screening mammogram for malignant neoplasm of breast: Secondary | ICD-10-CM

## 2012-09-18 ENCOUNTER — Ambulatory Visit (HOSPITAL_COMMUNITY)
Admission: RE | Admit: 2012-09-18 | Discharge: 2012-09-18 | Disposition: A | Discharge status: Home / Self Care | Payer: No Typology Code available for payment source | Source: Ambulatory Visit | Attending: Women's Health | Admitting: Women's Health

## 2012-09-18 DIAGNOSIS — Z1231 Encounter for screening mammogram for malignant neoplasm of breast: Secondary | ICD-10-CM | POA: Insufficient documentation

## 2012-09-19 ENCOUNTER — Ambulatory Visit (INDEPENDENT_AMBULATORY_CARE_PROVIDER_SITE_OTHER): Payer: No Typology Code available for payment source | Admitting: Women's Health

## 2012-09-19 ENCOUNTER — Encounter: Payer: Self-pay | Admitting: Women's Health

## 2012-09-19 VITALS — BP 110/72 | Ht 66.0 in | Wt 144.0 lb

## 2012-09-19 DIAGNOSIS — Z833 Family history of diabetes mellitus: Secondary | ICD-10-CM

## 2012-09-19 DIAGNOSIS — F32A Depression, unspecified: Secondary | ICD-10-CM

## 2012-09-19 DIAGNOSIS — Z01419 Encounter for gynecological examination (general) (routine) without abnormal findings: Secondary | ICD-10-CM

## 2012-09-19 DIAGNOSIS — Z13220 Encounter for screening for lipoid disorders: Secondary | ICD-10-CM

## 2012-09-19 DIAGNOSIS — F329 Major depressive disorder, single episode, unspecified: Secondary | ICD-10-CM

## 2012-09-19 DIAGNOSIS — E079 Disorder of thyroid, unspecified: Secondary | ICD-10-CM

## 2012-09-19 DIAGNOSIS — Z780 Asymptomatic menopausal state: Secondary | ICD-10-CM

## 2012-09-19 DIAGNOSIS — R21 Rash and other nonspecific skin eruption: Secondary | ICD-10-CM

## 2012-09-19 MED ORDER — DULOXETINE HCL 30 MG PO CPEP: 30.0000 mg | ORAL_CAPSULE | Freq: Every day | ORAL | Status: DC

## 2012-09-19 MED ORDER — BETAMETHASONE VALERATE 0.1 % EX OINT: TOPICAL_OINTMENT | Freq: Two times a day (BID) | CUTANEOUS | Status: DC

## 2012-09-19 NOTE — Patient Instructions (Addendum)
Health Recommendations for Postmenopausal Women Respected and ongoing research has looked at the most common causes of death, disability, and poor quality of life in postmenopausal women. The causes include heart disease, diseases of blood vessels, diabetes, depression, cancer, and bone loss (osteoporosis). Many things can be done to help lower the chances of developing these and other common problems: CARDIOVASCULAR DISEASE Heart Disease: A heart attack is a medical emergency. Know the signs and symptoms of a heart attack. Below are things women can do to reduce their risk for heart disease.   Do not smoke. If you smoke, quit.  Aim for a healthy weight. Being overweight causes many preventable deaths. Eat a healthy and balanced diet and drink an adequate amount of liquids.  Get moving. Make a commitment to be more physically active. Aim for 30 minutes of activity on most, if not all days of the week.  Eat for heart health. Choose a diet that is low in saturated fat and cholesterol and eliminate trans fat. Include whole grains, vegetables, and fruits. Read and understand the labels on food containers before buying.  Know your numbers. Ask your caregiver to check your blood pressure, cholesterol (total, HDL, LDL, triglycerides) and blood glucose. Work with your caregiver on improving your entire clinical picture.  High blood pressure. Limit or stop your table salt intake (try salt substitute and food seasonings). Avoid salty foods and drinks. Read labels on food containers before buying. Eating well and exercising can help control high blood pressure. STROKE  Stroke is a medical emergency. Stroke may be the result of a blood clot in a blood vessel in the brain or by a brain hemorrhage (bleeding). Know the signs and symptoms of a stroke. To lower the risk of developing a stroke:  Avoid fatty foods.  Quit smoking.  Control your diabetes, blood pressure, and irregular heart rate. THROMBOPHLEBITIS  (BLOOD CLOT) OF THE LEG  Becoming overweight and leading a stationary lifestyle may also contribute to developing blood clots. Controlling your diet and exercising will help lower the risk of developing blood clots. CANCER SCREENING  Breast Cancer: Take steps to reduce your risk of breast cancer.  You should practice "breast self-awareness." This means understanding the normal appearance and feel of your breasts and should include breast self-examination. Any changes detected, no matter how small, should be reported to your caregiver.  After age 40, you should have a clinical breast exam (CBE) every year.  Starting at age 40, you should consider having a mammogram (breast X-ray) every year.  If you have a family history of breast cancer, talk to your caregiver about genetic screening.  If you are at high risk for breast cancer, talk to your caregiver about having an MRI and a mammogram every year.  Intestinal or Stomach Cancer: Tests to consider are a rectal exam, fecal occult blood, sigmoidoscopy, and colonoscopy. Women who are high risk may need to be screened at an earlier age and more often.  Cervical Cancer:  Beginning at age 30, you should have a Pap test every 3 years as long as the past 3 Pap tests have been normal.  If you have had past treatment for cervical cancer or a condition that could lead to cancer, you need Pap tests and screening for cancer for at least 20 years after your treatment.  If you had a hysterectomy for a problem that was not cancer or a condition that could lead to cancer, then you no longer need Pap tests.    If you are between ages 65 and 70, and you have had normal Pap tests going back 10 years, you no longer need Pap tests.  If Pap tests have been discontinued, risk factors (such as a new sexual partner) need to be reassessed to determine if screening should be resumed.  Some medical problems can increase the chance of getting cervical cancer. In these  cases, your caregiver may recommend more frequent screening and Pap tests.  Uterine Cancer: If you have vaginal bleeding after reaching menopause, you should notify your caregiver.  Ovarian cancer: Other than yearly pelvic exams, there are no reliable tests available to screen for ovarian cancer at this time except for yearly pelvic exams.  Lung Cancer: Yearly chest X-rays can detect lung cancer and should be done on high risk women, such as cigarette smokers and women with chronic lung disease (emphysema).  Skin Cancer: A complete body skin exam should be done at your yearly examination. Avoid overexposure to the sun and ultraviolet light lamps. Use a strong sun block cream when in the sun. All of these things are important in lowering the risk of skin cancer. MENOPAUSE Menopause Symptoms: Hormone therapy products are effective for treating symptoms associated with menopause:  Moderate to severe hot flashes.  Night sweats.  Mood swings.  Headaches.  Tiredness.  Loss of sex drive.  Insomnia.  Other symptoms. Hormone replacement carries certain risks, especially in older women. Women who use or are thinking about using estrogen or estrogen with progestin treatments should discuss that with their caregiver. Your caregiver will help you understand the benefits and risks. The ideal dose of hormone replacement therapy is not known. The Food and Drug Administration (FDA) has concluded that hormone therapy should be used only at the lowest doses and for the shortest amount of time to reach treatment goals.  OSTEOPOROSIS Protecting Against Bone Loss and Preventing Fracture: If you use hormone therapy for prevention of bone loss (osteoporosis), the risks for bone loss must outweigh the risk of the therapy. Ask your caregiver about other medications known to be safe and effective for preventing bone loss and fractures. To guard against bone loss or fractures, the following is recommended:  If  you are less than age 50, take 1000 mg of calcium and at least 600 mg of Vitamin D per day.  If you are greater than age 50 but less than age 70, take 1200 mg of calcium and at least 600 mg of Vitamin D per day.  If you are greater than age 70, take 1200 mg of calcium and at least 800 mg of Vitamin D per day. Smoking and excessive alcohol intake increases the risk of osteoporosis. Eat foods rich in calcium and vitamin D and do weight bearing exercises several times a week as your caregiver suggests. DIABETES Diabetes Melitus: If you have Type I or Type 2 diabetes, you should keep your blood sugar under control with diet, exercise and recommended medication. Avoid too many sweets, starchy and fatty foods. Being overweight can make control more difficult. COGNITION AND MEMORY Cognition and Memory: Menopausal hormone therapy is not recommended for the prevention of cognitive disorders such as Alzheimer's disease or memory loss.  DEPRESSION  Depression may occur at any age, but is common in elderly women. The reasons may be because of physical, medical, social (loneliness), or financial problems and needs. If you are experiencing depression because of medical problems and control of symptoms, talk to your caregiver about this. Physical activity and   exercise may help with mood and sleep. Community and volunteer involvement may help your sense of value and worth. If you have depression and you feel that the problem is getting worse or becoming severe, talk to your caregiver about treatment options that are best for you. ACCIDENTS  Accidents are common and can be serious in the elderly woman. Prepare your house to prevent accidents. Eliminate throw rugs, place hand bars in the bath, shower and toilet areas. Avoid wearing high heeled shoes or walking on wet, snowy, and icy areas. Limit or stop driving if you have vision or hearing problems, or you feel you are unsteady with you movements and  reflexes. HEPATITIS C Hepatitis C is a type of viral infection affecting the liver. It is spread mainly through contact with blood from an infected person. It can be treated, but if left untreated, it can lead to severe liver damage over years. Many people who are infected do not know that the virus is in their blood. If you are a "baby-boomer", it is recommended that you have one screening test for Hepatitis C. IMMUNIZATIONS  Several immunizations are important to consider having during your senior years, including:   Tetanus, diptheria, and pertussis booster shot.  Influenza every year before the flu season begins.  Pneumonia vaccine.  Shingles vaccine.  Others as indicated based on your specific needs. Talk to your caregiver about these. Document Released: 05/11/2005 Document Revised: 03/05/2012 Document Reviewed: 01/05/2008 [employer] Patient Information 2014 [employer].  

## 2012-09-19 NOTE — Progress Notes (Signed)
Monica Hood [birth year] [id number]    History:    The patient presents for annual exam.  Postmenopausal on no HRT/no bleeding. Not sexually active. History of normal Paps and mammograms. Negative colonoscopy 2005. Has had some problems with eczema has seen a dermatologist. Primary cares managed depression in past requests a refill for Cymbalta 30 which has kept her symptoms stable. Mother had breast cancer but died from Parkinson's.   Past medical history, past surgical history, family history and social history were all reviewed and documented in the EPIC chart. Working at a school in the front office. Father having some problems with dementia.   ROS:  A  ROS was performed and pertinent positives and negatives are included in the history.  Exam:  Filed Vitals:   09/19/12 1408  BP: 110/72    General appearance:  Normal Head/Neck:  Normal, without cervical or supraclavicular adenopathy. Thyroid:  Symmetrical, normal in size, without palpable masses or nodularity. Respiratory  Effort:  Normal  Auscultation:  Clear without wheezing or rhonchi Cardiovascular  Auscultation:  Regular rate, without rubs, murmurs or gallops  Edema/varicosities:  Not grossly evident Abdominal  Soft,nontender, without masses, guarding or rebound.  Liver/spleen:  No organomegaly noted  Hernia:  None appreciated  Skin  Inspection:  Grossly normal  Palpation:  Grossly normal Neurologic/psychiatric  Orientation:  Normal with appropriate conversation.  Mood/affect:  Normal  Genitourinary    Breasts: Examined lying and sitting.     Right: Without masses, retractions, discharge or axillary adenopathy.     Left: Without masses, retractions, discharge or axillary adenopathy.   Inguinal/mons:  Normal without inguinal adenopathy  External genitalia:  Normal  BUS/Urethra/Skene's glands:  Normal  Bladder:  Normal  Vagina:  Normal  Cervix:  Normal  Uterus:   normal in size, shape and contour.  Midline and  mobile  Adnexa/parametria:     Rt: Without masses or tenderness.   Lt: Without masses or tenderness.  Anus and perineum: Normal  Digital rectal exam: Normal sphincter tone without palpated masses or tenderness  Assessment/Plan:  57 y.o. SWF G1P0  for annual exam with no complaints.  Depression stable on Cymbalta Normal postmenopausal/no HRT/no bleeding eczema  Plan: SBE's, continue annual mammogram, calcium rich diet, vitamin D 2000 daily encouraged. DEXA, will schedule here. Home safety and fall prevention discussed. Condoms encouraged if become sexually active. Cymbalta 30 mg by mouth daily, reviewed importance of exercise and leisure activities in relationship to mood. CBC, glucose, lipid panel, TSH, UA, Pap normal 2013, new screening guidelines reviewed.   Harrington Challenger WHNP, 3:21 PM 09/19/2012

## 2012-09-20 LAB — LIPID PANEL
Cholesterol: 207 mg/dL — ABNORMAL HIGH (ref 0–200)
HDL: 47 mg/dL (ref >39)
Total CHOL/HDL Ratio: 4.4 Ratio
Triglycerides: 73 mg/dL (ref <150)

## 2012-09-20 LAB — CBC WITH DIFFERENTIAL/PLATELET
Eosinophils Absolute: 0.2 10*3/uL (ref 0.0–0.7)
Hemoglobin: 14.4 g/dL (ref 12.0–15.0)
Lymphocytes Relative: 43 % (ref 12–46)
Lymphs Abs: 2.6 10*3/uL (ref 0.7–4.0)
MCH: 31.6 pg (ref 26.0–34.0)
Monocytes Relative: 9 % (ref 3–12)
Neutrophils Relative %: 44 % (ref 43–77)
RBC: 4.55 MIL/uL (ref 3.87–5.11)
WBC: 6 10*3/uL (ref 4.0–10.5)

## 2012-09-20 LAB — URINALYSIS W MICROSCOPIC + REFLEX CULTURE
Bilirubin Urine: NEGATIVE
Casts: NONE SEEN
Glucose, UA: NEGATIVE mg/dL
Hgb urine dipstick: NEGATIVE
Ketones, ur: NEGATIVE mg/dL
Protein, ur: NEGATIVE mg/dL
pH: 6.5 (ref 5.0–8.0)

## 2012-09-20 LAB — TSH: TSH: 1.104 u[IU]/mL (ref 0.350–4.500)

## 2012-09-20 LAB — GLUCOSE, RANDOM: Glucose, Bld: 79 mg/dL (ref 70–99)

## 2013-06-24 ENCOUNTER — Encounter: Payer: Self-pay | Admitting: Family Medicine

## 2013-06-24 ENCOUNTER — Telehealth: Payer: Self-pay | Admitting: Internal Medicine

## 2013-06-24 ENCOUNTER — Ambulatory Visit (INDEPENDENT_AMBULATORY_CARE_PROVIDER_SITE_OTHER): Payer: No Typology Code available for payment source | Admitting: Family Medicine

## 2013-06-24 VITALS — BP 110/70 | HR 73 | Wt 148.0 lb

## 2013-06-24 DIAGNOSIS — R202 Paresthesia of skin: Secondary | ICD-10-CM

## 2013-06-24 DIAGNOSIS — R209 Unspecified disturbances of skin sensation: Secondary | ICD-10-CM

## 2013-06-24 NOTE — Progress Notes (Signed)
   Subjective:    Patient ID: Monica Hood, female    DOB: [birth year], 58 y.o.   MRN: [id number]  HPI Patient seen with intermittent numbness left upper extremity over the past week. Symptoms are very transient lasting a few seconds. Location is left posterior arm and radiating down to the forearm and mostly involving thumb. She has not noted any weakness. No neck pain. No chest pain. No upper extremity pains. No history of similar symptoms. No other paresthesias. She did some weight lifting last week with a new exercise program but does not recall any injury.  Denies any recent headaches. No confusion. No lower extremity symptoms. Symptoms are relatively mild. No exacerbating or alleviating factors. Takes low dose Cymbalta for depression and has been on this for several years.  Past Medical History  Diagnosis Date  . Smoker     1/2 pack per week  . Hypercholesteremia    No past surgical history on file.  reports that she has quit smoking. She has never used smokeless tobacco. She reports that she drinks about 1.8 ounces of alcohol per week. She reports that she does not use illicit drugs. family history includes Breast cancer in her mother; Diabetes in her brother; Heart disease in her father; Hypertension in her father; Parkinsonism (age of onset: 4870) in her mother. No Known Allergies    Review of Systems  Constitutional: Negative for fever, chills, appetite change and unexpected weight change.  Eyes: Negative for visual disturbance.  Respiratory: Negative for cough and shortness of breath.   Cardiovascular: Negative for chest pain.  Neurological: Positive for numbness. Negative for dizziness, tremors, syncope, weakness, light-headedness and headaches.  Hematological: Negative for adenopathy.  Psychiatric/Behavioral: Negative for confusion.       Objective:   Physical Exam  Vitals reviewed. Constitutional: She is oriented to person, place, and time. She appears well-developed  and well-nourished.  Neck: Neck supple. No thyromegaly present.  Cardiovascular: Normal rate.  Exam reveals no gallop.   No murmur heard. Pulmonary/Chest: Effort normal and breath sounds normal. No respiratory distress. She has no wheezes. She has no rales.  Musculoskeletal: Normal range of motion. She exhibits no edema.  Lymphadenopathy:    She has no cervical adenopathy.  Neurological: She is alert and oriented to person, place, and time. No cranial nerve deficit.  Symmetric reflexes upper extremities. No focal strength deficits. Normal sensory function to touch. Gait is normal.          Assessment & Plan:  Intermittent paresthesias left upper extremity. She does not have any pain or other findings to suggest carpal tunnel. She's not describing any neck pain or obvious cervical radiculopathy symptoms. Symptoms are very transient lasting only a few seconds. She has nonfocal exam. Would recommend observation. If persisting in 2 weeks -or worsening symptoms sooner- followup for further evaluation

## 2013-06-24 NOTE — Patient Instructions (Signed)
Follow up in 2 weeks if numbness not resolving. Follow up sooner for any weakness or progressive numbness or pain.

## 2013-06-24 NOTE — Telephone Encounter (Signed)
Pt had called about numb left arm x 1 week. RN called number available and left vm for pt to call back.

## 2013-06-24 NOTE — Telephone Encounter (Signed)
Patient Information:  Caller Name: Noreene LarssonJill  Phone: [phone]  Patient: Monica SaundersCarson, Monica Hood  Gender: Female  DOB: 06/01/1955  Age: 58 Years  PCP: Darryll CapersJenkins, John (Adults only)  Pregnant: No  Office Follow Up:  Does the office need to follow up with this patient?: No  Instructions For The Office: N/A   Symptoms  Reason For Call & Symptoms: Calling about numbness and tingling down L arm on and off for past week. She started back with Body Pump Class [facility] [employer] where she is using a weight bar to exercise and sx started after that class. She does have any muscle soreness. Sx worse with putting arm over head,  with gripping stering wheel and when she wakes up in the mornings. No change in coordination, good grip. Shaking/moving arm does not help improve tingling or numbness. No other sx.  Reviewed Health History In EMR: Yes  Reviewed Medications In EMR: Yes  Reviewed Allergies In EMR: Yes  Reviewed Surgeries / Procedures: Yes  Date of Onset of Symptoms: 06/17/2013  Treatments Tried: Exercising /moving hands and fingers  Treatments Tried Worked: No OB / GYN:  LMP: Unknown  Guideline(s) Used:  Arm Pain  Disposition Per Guideline:   See Today or Tomorrow in Office  Reason For Disposition Reached:   Numbness (i.e., loss of sensation) in hand or fingers  Advice Given:  Reassurance - Muscle Strain  Definition: A muscle strain occurs from over-stretching or tearing a muscle. People often call this a "pulled muscle". This muscle injury can occur while exercising, while lifting something, or sometimes during normal activities. -  Reassurance - Overuse  Definition: Sore muscles are common following vigorous activity (overuse injury), especially when your body is not used to this amount of activity (e.g., sports, weight lifting, moving furniture).  Symptoms: People often describe a diffuse soreness and aching in the over-used muscles.  Apply Cold to the Area for First 48 Hours  Apply a cold pack or an  ice bag (wrapped in a moist towel) to the area for 20 minutes. Repeat in 1 hour, then every 4 hours while awake.  Rest:   You should try to avoid any exercise or activity that caused this pain for the next 3 days.  Expected Course  Muscle Strain: A minor muscle strain usually hurts for 2 - 3 days. The pain often peaks on day 2. A more severe muscle strain can hurt for 2-4 weeks.  Muscle Overuse: Sore muscles from overuse usually hurts for 2 - 4 days. The pain often peaks on day 2  Call Back If:  Moderate pain (e.g. interferes with normal activities) lasts more than 3 days  Mild pain lasts more than 7 days  You become worse.   Requesting appointment: Scheduled with Dr. Caryl NeverBurchette at 16:30 06/24/13

## 2013-06-24 NOTE — Progress Notes (Signed)
Pre visit review using our clinic review tool, if applicable. No additional management support is needed unless otherwise documented below in the visit note. 

## 2013-07-08 ENCOUNTER — Telehealth: Payer: Self-pay | Admitting: Internal Medicine

## 2013-07-08 NOTE — Telephone Encounter (Signed)
Patient Information:  Caller Name: Monica Hood  Phone: [phone]  Patient: Monica Hood, [name]  Gender: Female  DOB: [birth year]  Age: 58 Years  PCP: Darryll CapersJenkins, John (Adults only)  Office Follow Up:  Does the office need to follow up with this patient?: No  Instructions For The Office: N/A   Symptoms  Reason For Call & Symptoms: Seen in office 2 weeks ago for L arm numbness and she is still having sx. Dr. Caryl NeverBurchette advised that she f/u if still having sx. Sx slightly improved since off of work this week and not bending and stooping as much (works as Manufacturing systems engineerpreschool teacher). Sx worse when she leans forward.   Reviewed Health History In EMR: Yes  Reviewed Medications In EMR: Yes  Reviewed Allergies In EMR: Yes  Reviewed Surgeries / Procedures: Yes  Date of Onset of Symptoms: 06/17/2013  Guideline(s) Used:  No Protocol Available - Sick Adult  Disposition Per Guideline:   See Within 3 Days in Office  Reason For Disposition Reached:   Nursing judgment (see previous triage on 06/24/13)  Advice Given:  Call Back If:  New symptoms develop  You become worse.  Patient Will Follow Care Advice:  YES  Appointment Scheduled:  07/09/2013 09:45:00 Appointment Scheduled Provider:  Evelena PeatBurchette, [facility](Family Practice)

## 2013-07-09 ENCOUNTER — Encounter: Payer: Self-pay | Admitting: Family Medicine

## 2013-07-09 ENCOUNTER — Ambulatory Visit (INDEPENDENT_AMBULATORY_CARE_PROVIDER_SITE_OTHER)
Admission: RE | Admit: 2013-07-09 | Discharge: 2013-07-09 | Disposition: A | Discharge status: Home / Self Care | Payer: No Typology Code available for payment source | Source: Ambulatory Visit | Attending: Family Medicine | Admitting: Family Medicine

## 2013-07-09 ENCOUNTER — Ambulatory Visit (INDEPENDENT_AMBULATORY_CARE_PROVIDER_SITE_OTHER): Payer: No Typology Code available for payment source | Admitting: Family Medicine

## 2013-07-09 VITALS — BP 104/70 | HR 94 | Wt 143.0 lb

## 2013-07-09 DIAGNOSIS — R209 Unspecified disturbances of skin sensation: Secondary | ICD-10-CM

## 2013-07-09 DIAGNOSIS — M25569 Pain in unspecified knee: Secondary | ICD-10-CM

## 2013-07-09 DIAGNOSIS — M25562 Pain in left knee: Secondary | ICD-10-CM

## 2013-07-09 DIAGNOSIS — R202 Paresthesia of skin: Secondary | ICD-10-CM

## 2013-07-09 NOTE — Progress Notes (Signed)
Subjective:    Patient ID: Monica LocksJill L Shawley, female    DOB: 1955/12/14, 58 y.o.   MRN: [id number]  HPI Patient is here for followup regarding left upper extremity numbness. Refer to prior note:  "Patient seen with intermittent numbness left upper extremity over the past week. Symptoms are very transient lasting a few seconds. Location is left posterior arm and radiating down to the forearm and mostly involving thumb. She has not noted any weakness. No neck pain. No chest pain. No upper extremity pains. No history of similar symptoms. No other paresthesias. She did some weight lifting last week with a new exercise program but does not recall any injury.  Denies any recent headaches. No confusion. No lower extremity symptoms. Symptoms are relatively mild. No exacerbating or alleviating factors. Takes low dose Cymbalta for depression and has been on this for several years."  Her numbness is intermittent. This mostly involves her left hand and prominently left thumb. She's had no weakness. No neck pain. Her numbness is relieved with change of position especially with not resting her left upper extremity against things like armchair rest. She's never had any cervical neck problems. Denies any other areas of numbness. No headache. No confusion  Second issue is right knee pain. Present for about 2 weeks. Sometimes at rest sometimes with walking. Location is medial knee. No effusion. No warmth. No erythema. No history of weakness. No reported injury. She's never had x-rays. Family history of degenerative arthritis in the knees  Past Medical History  Diagnosis Date  . Smoker     1/2 pack per week  . Hypercholesteremia    No past surgical history on file.  reports that she has quit smoking. She has never used smokeless tobacco. She reports that she drinks about 1.8 ounces of alcohol per week. She reports that she does not use illicit drugs. family history includes Breast cancer in her mother; Diabetes in  her brother; Heart disease in her father; Hypertension in her father; Parkinsonism (age of onset: 6270) in her mother. No Known Allergies     Review of Systems  Constitutional: Negative for appetite change and unexpected weight change.  Respiratory: Negative for shortness of breath.   Cardiovascular: Negative for chest pain.  Neurological: Positive for numbness. Negative for dizziness, seizures, syncope, weakness and headaches.       Objective:   Physical Exam  Constitutional: She is oriented to person, place, and time. She appears well-developed and well-nourished.  Cardiovascular: Normal rate.   Pulmonary/Chest: Effort normal and breath sounds normal. No respiratory distress. She has no wheezes. She has no rales.  Musculoskeletal:  Right knee reveals no effusion. No erythema. No warmth. She has moderate medial joint line tenderness. No lateral jointline tenderness. Ligament testing is normal.  Neurological: She is alert and oriented to person, place, and time. No cranial nerve deficit.  Full-strength left upper extremity with symmetric reflexes. No sensory impairment to touch          Assessment & Plan:  #1 right knee pain. Suspect degenerative arthritis. Obtain plain x-rays. She will try over-the-counter glucosamine #2 left upper extremity numbness. This does not sound cervical in origin. The fact that this is positional suggests peripheral nerve origin. We discussed possibility of nerve testing  And at this point she wishes to wait. She will try left wrist splint as a first step see if this relieves her symptoms in case this might be carpal tunnel related. Touch base 2-3 weeks if not improving

## 2013-07-09 NOTE — Patient Instructions (Signed)
Try over the counter wrist brace to left wrist Be in touch if no improvement in 2-3 weeks.

## 2013-07-09 NOTE — Progress Notes (Signed)
Pre visit review using our clinic review tool, if applicable. No additional management support is needed unless otherwise documented below in the visit note. 

## 2013-12-14 ENCOUNTER — Other Ambulatory Visit: Payer: Self-pay | Admitting: Women's Health

## 2013-12-14 DIAGNOSIS — Z1231 Encounter for screening mammogram for malignant neoplasm of breast: Secondary | ICD-10-CM

## 2013-12-16 ENCOUNTER — Ambulatory Visit (HOSPITAL_COMMUNITY)
Admission: RE | Admit: 2013-12-16 | Discharge: 2013-12-16 | Disposition: A | Discharge status: Home / Self Care | Payer: BC Managed Care – PPO | Source: Ambulatory Visit | Attending: Women's Health | Admitting: Women's Health

## 2013-12-16 DIAGNOSIS — Z1231 Encounter for screening mammogram for malignant neoplasm of breast: Secondary | ICD-10-CM | POA: Insufficient documentation

## 2013-12-18 ENCOUNTER — Ambulatory Visit (INDEPENDENT_AMBULATORY_CARE_PROVIDER_SITE_OTHER): Payer: BC Managed Care – PPO | Admitting: Women's Health

## 2013-12-18 ENCOUNTER — Encounter: Payer: Self-pay | Admitting: Women's Health

## 2013-12-18 VITALS — BP 112/70 | Ht 66.75 in | Wt 143.4 lb

## 2013-12-18 DIAGNOSIS — Z23 Encounter for immunization: Secondary | ICD-10-CM

## 2013-12-18 DIAGNOSIS — Z01419 Encounter for gynecological examination (general) (routine) without abnormal findings: Secondary | ICD-10-CM

## 2013-12-18 DIAGNOSIS — Z13220 Encounter for screening for lipoid disorders: Secondary | ICD-10-CM

## 2013-12-18 DIAGNOSIS — E079 Disorder of thyroid, unspecified: Secondary | ICD-10-CM

## 2013-12-18 LAB — COMPREHENSIVE METABOLIC PANEL
ALBUMIN: 4.2 g/dL (ref 3.5–5.2)
ALK PHOS: 91 U/L (ref 39–117)
ALT: 9 U/L (ref 0–35)
AST: 15 U/L (ref 0–37)
BUN: 19 mg/dL (ref 6–23)
CHLORIDE: 104 meq/L (ref 96–112)
CO2: 23 mEq/L (ref 19–32)
Calcium: 9.4 mg/dL (ref 8.4–10.5)
Creat: 0.66 mg/dL (ref 0.50–1.10)
Glucose, Bld: 71 mg/dL (ref 70–99)
Potassium: 4.3 mEq/L (ref 3.5–5.3)
Sodium: 139 mEq/L (ref 135–145)
TOTAL PROTEIN: 7 g/dL (ref 6.0–8.3)
Total Bilirubin: 0.6 mg/dL (ref 0.2–1.2)

## 2013-12-18 LAB — LIPID PANEL
Cholesterol: 183 mg/dL (ref 0–200)
HDL: 51 mg/dL (ref >39)
LDL Cholesterol: 120 mg/dL — ABNORMAL HIGH (ref 0–99)
Total CHOL/HDL Ratio: 3.6 Ratio
Triglycerides: 61 mg/dL (ref <150)
VLDL: 12 mg/dL (ref 0–40)

## 2013-12-18 LAB — CBC WITH DIFFERENTIAL/PLATELET
BASOS ABS: 0 10*3/uL (ref 0.0–0.1)
BASOS PCT: 0 % (ref 0–1)
Eosinophils Absolute: 0.3 10*3/uL (ref 0.0–0.7)
Eosinophils Relative: 5 % (ref 0–5)
HCT: 46.5 % — ABNORMAL HIGH (ref 36.0–46.0)
Hemoglobin: 16.2 g/dL — ABNORMAL HIGH (ref 12.0–15.0)
Lymphocytes Relative: 45 % (ref 12–46)
Lymphs Abs: 2.3 10*3/uL (ref 0.7–4.0)
MCH: 32.7 pg (ref 26.0–34.0)
MCHC: 34.8 g/dL (ref 30.0–36.0)
MCV: 93.8 fL (ref 78.0–100.0)
Monocytes Absolute: 0.5 10*3/uL (ref 0.1–1.0)
Monocytes Relative: 9 % (ref 3–12)
NEUTROS ABS: 2.1 10*3/uL (ref 1.7–7.7)
NEUTROS PCT: 41 % — AB (ref 43–77)
Platelets: 238 10*3/uL (ref 150–400)
RBC: 4.96 MIL/uL (ref 3.87–5.11)
RDW: 14 % (ref 11.5–15.5)
WBC: 5 10*3/uL (ref 4.0–10.5)

## 2013-12-18 NOTE — Addendum Note (Signed)
Addended by: Richardson Chiquito on: 12/18/2013 03:51 PM   Modules accepted: Orders

## 2013-12-18 NOTE — Patient Instructions (Signed)
Health Recommendations for Postmenopausal Women Respected and ongoing research has looked at the most common causes of death, disability, and poor quality of life in postmenopausal women. The causes include heart disease, diseases of blood vessels, diabetes, depression, cancer, and bone loss (osteoporosis). Many things can be done to help lower the chances of developing these and other common problems. CARDIOVASCULAR DISEASE Heart Disease: A heart attack is a medical emergency. Know the signs and symptoms of a heart attack. Below are things women can do to reduce their risk for heart disease.   Do not smoke. If you smoke, quit.  Aim for a healthy weight. Being overweight causes many preventable deaths. Eat a healthy and balanced diet and drink an adequate amount of liquids.  Get moving. Make a commitment to be more physically active. Aim for 30 minutes of activity on most, if not all days of the week.  Eat for heart health. Choose a diet that is low in saturated fat and cholesterol and eliminate trans fat. Include whole grains, vegetables, and fruits. Read and understand the labels on food containers before buying.  Know your numbers. Ask your caregiver to check your blood pressure, cholesterol (total, HDL, LDL, triglycerides) and blood glucose. Work with your caregiver on improving your entire clinical picture.  High blood pressure. Limit or stop your table salt intake (try salt substitute and food seasonings). Avoid salty foods and drinks. Read labels on food containers before buying. Eating well and exercising can help control high blood pressure. STROKE  Stroke is a medical emergency. Stroke may be the result of a blood clot in a blood vessel in the brain or by a brain hemorrhage (bleeding). Know the signs and symptoms of a stroke. To lower the risk of developing a stroke:  Avoid fatty foods.  Quit smoking.  Control your diabetes, blood pressure, and irregular heart rate. THROMBOPHLEBITIS  (BLOOD CLOT) OF THE LEG  Becoming overweight and leading a stationary lifestyle may also contribute to developing blood clots. Controlling your diet and exercising will help lower the risk of developing blood clots. CANCER SCREENING  Breast Cancer: Take steps to reduce your risk of breast cancer.  You should practice "breast self-awareness." This means understanding the normal appearance and feel of your breasts and should include breast self-examination. Any changes detected, no matter how small, should be reported to your caregiver.  After age 40, you should have a clinical breast exam (CBE) every year.  Starting at age 40, you should consider having a mammogram (breast X-ray) every year.  If you have a family history of breast cancer, talk to your caregiver about genetic screening.  If you are at high risk for breast cancer, talk to your caregiver about having an MRI and a mammogram every year.  Intestinal or Stomach Cancer: Tests to consider are a rectal exam, fecal occult blood, sigmoidoscopy, and colonoscopy. Women who are high risk may need to be screened at an earlier age and more often.  Cervical Cancer:  Beginning at age 30, you should have a Pap test every 3 years as long as the past 3 Pap tests have been normal.  If you have had past treatment for cervical cancer or a condition that could lead to cancer, you need Pap tests and screening for cancer for at least 20 years after your treatment.  If you had a hysterectomy for a problem that was not cancer or a condition that could lead to cancer, then you no longer need Pap tests.    If you are between ages 65 and 70, and you have had normal Pap tests going back 10 years, you no longer need Pap tests.  If Pap tests have been discontinued, risk factors (such as a new sexual partner) need to be reassessed to determine if screening should be resumed.  Some medical problems can increase the chance of getting cervical cancer. In these  cases, your caregiver may recommend more frequent screening and Pap tests.  Uterine Cancer: If you have vaginal bleeding after reaching menopause, you should notify your caregiver.  Ovarian Cancer: Other than yearly pelvic exams, there are no reliable tests available to screen for ovarian cancer at this time except for yearly pelvic exams.  Lung Cancer: Yearly chest X-rays can detect lung cancer and should be done on high risk women, such as cigarette smokers and women with chronic lung disease (emphysema).  Skin Cancer: A complete body skin exam should be done at your yearly examination. Avoid overexposure to the sun and ultraviolet light lamps. Use a strong sun block cream when in the sun. All of these things are important for lowering the risk of skin cancer. MENOPAUSE Menopause Symptoms: Hormone therapy products are effective for treating symptoms associated with menopause:  Moderate to severe hot flashes.  Night sweats.  Mood swings.  Headaches.  Tiredness.  Loss of sex drive.  Insomnia.  Other symptoms. Hormone replacement carries certain risks, especially in older women. Women who use or are thinking about using estrogen or estrogen with progestin treatments should discuss that with their caregiver. Your caregiver will help you understand the benefits and risks. The ideal dose of hormone replacement therapy is not known. The Food and Drug Administration (FDA) has concluded that hormone therapy should be used only at the lowest doses and for the shortest amount of time to reach treatment goals.  OSTEOPOROSIS Protecting Against Bone Loss and Preventing Fracture If you use hormone therapy for prevention of bone loss (osteoporosis), the risks for bone loss must outweigh the risk of the therapy. Ask your caregiver about other medications known to be safe and effective for preventing bone loss and fractures. To guard against bone loss or fractures, the following is recommended:  If  you are younger than age 50, take 1000 mg of calcium and at least 600 mg of Vitamin D per day.  If you are older than age 50 but younger than age 70, take 1200 mg of calcium and at least 600 mg of Vitamin D per day.  If you are older than age 70, take 1200 mg of calcium and at least 800 mg of Vitamin D per day. Smoking and excessive alcohol intake increases the risk of osteoporosis. Eat foods rich in calcium and vitamin D and do weight bearing exercises several times a week as your caregiver suggests. DIABETES Diabetes Mellitus: If you have type I or type 2 diabetes, you should keep your blood sugar under control with diet, exercise, and recommended medication. Avoid starchy and fatty foods, and too many sweets. Being overweight can make diabetes control more difficult. COGNITION AND MEMORY Cognition and Memory: Menopausal hormone therapy is not recommended for the prevention of cognitive disorders such as Alzheimer's disease or memory loss.  DEPRESSION  Depression may occur at any age, but it is common in elderly women. This may be because of physical, medical, social (loneliness), or financial problems and needs. If you are experiencing depression because of medical problems and control of symptoms, talk to your caregiver about this. Physical   activity and exercise may help with mood and sleep. Community and volunteer involvement may improve your sense of value and worth. If you have depression and you feel that the problem is getting worse or becoming severe, talk to your caregiver about which treatment options are best for you. ACCIDENTS  Accidents are common and can be serious in elderly woman. Prepare your house to prevent accidents. Eliminate throw rugs, place hand bars in bath, shower, and toilet areas. Avoid wearing high heeled shoes or walking on wet, snowy, and icy areas. Limit or stop driving if you have vision or hearing problems, or if you feel you are unsteady with your movements and  reflexes. HEPATITIS C Hepatitis C is a type of viral infection affecting the liver. It is spread mainly through contact with blood from an infected person. It can be treated, but if left untreated, it can lead to severe liver damage over the years. Many people who are infected do not know that the virus is in their blood. If you are a "baby-boomer", it is recommended that you have one screening test for Hepatitis C. IMMUNIZATIONS  Several immunizations are important to consider having during your senior years, including:   Tetanus, diphtheria, and pertussis booster shot.  Influenza every year before the flu season begins.  Pneumonia vaccine.  Shingles vaccine.  Others, as indicated based on your specific needs. Talk to your caregiver about these. Document Released: 05/11/2005 Document Revised: 08/03/2013 Document Reviewed: 01/05/2008 Exit[employer] Patient Information 2015 [employer]. This information is not intended to replace advice given to you by your health care provider. Make sure you discuss any questions you have with your health care provider.  

## 2013-12-18 NOTE — Progress Notes (Signed)
Monica Hood 12-07-1955 [id number]    History:    Presents for annual exam.  Postmenopausal on no HRT/no bleeding. Not sexually active. History of normal paps- last pap 2013. Normal mammograms- 2015 dense breast tissue.  Negative colonoscopy 2005. No PCP.  Cymbalta 30 stable- [facility] [place]. Mother had breast cancer but died from Parkinson's.     Past medical history, past surgical history, family history and social history were all reviewed and documented in the EPIC chart.  Exercise 4x/week.  Out of work- looking for job.  Father in [place] with dementia.    ROS:  A  12 point ROS was performed and pertinent positives and negatives are included.  Exam:  Filed Vitals:   12/18/13 0932  BP: 112/70    General appearance:  Normal Thyroid:  Symmetrical, normal in size, without palpable masses or nodularity. Respiratory  Auscultation:  Clear without wheezing or rhonchi Cardiovascular  Auscultation:  Regular rate, without rubs, murmurs or gallops  Edema/varicosities:  Not grossly evident Abdominal  Soft,nontender, without masses, guarding or rebound.  Liver/spleen:  No organomegaly noted  Hernia:  None appreciated  Skin  Inspection:  Grossly normal   Breasts: Examined lying and sitting.     Right: Without masses, retractions, discharge or axillary adenopathy.     Left: Without masses, retractions, discharge or axillary adenopathy. Gentitourinary   Inguinal/mons:  Normal without inguinal adenopathy  External genitalia:  Normal  BUS/Urethra/Skene's glands:  Normal  Vagina:  Normal  Cervix:  Normal  Uterus:  Normal in size, shape and contour.  Midline and mobile  Adnexa/parametria:     Rt: Without masses or tenderness.   Lt: Without masses or tenderness.  Anus and perineum: Normal  Digital rectal exam: Normal sphincter tone without palpated masses or tenderness  Assessment/Plan:  58 y.o. SWF G1P0 for annual exam.     Depression stable on Cymbalta  Normal  postmenopausal/no HRT/no bleeding   Plan: Continue annual mammogram- 3D recommended-dense breast tissue.  DEXA, will schedule. Colonoscopy due in 2015, started to schedule. Condoms encouraged if become sexually active. Cymbalta 30 mg by mouth daily.  SBE's, calcium rich diet, vitamin D 2000 daily encouraged.  Continue regular exercise and healthy diet.   Home safety and fall prevention discussed.   CBC, glucose, lipid panel, TSH, UA pending.   Pap normal 2013, new screening guidelines reviewed. Influenza vaccine given.      Harrington Challenger [facility], 10:13 AM 12/18/2013

## 2013-12-19 LAB — URINALYSIS W MICROSCOPIC + REFLEX CULTURE
BILIRUBIN URINE: NEGATIVE
Casts: NONE SEEN
GLUCOSE, UA: NEGATIVE mg/dL
Hgb urine dipstick: NEGATIVE
KETONES UR: NEGATIVE mg/dL
Leukocytes, UA: NEGATIVE
Nitrite: NEGATIVE
Protein, ur: NEGATIVE mg/dL
Specific Gravity, Urine: 1.023 (ref 1.005–1.030)
UROBILINOGEN UA: 0.2 mg/dL (ref 0.0–1.0)
pH: 5.5 (ref 5.0–8.0)

## 2013-12-19 LAB — TSH: TSH: 1.392 u[IU]/mL (ref 0.350–4.500)

## 2014-01-07 ENCOUNTER — Encounter: Payer: No Typology Code available for payment source | Admitting: Women's Health

## 2014-02-01 ENCOUNTER — Encounter: Payer: Self-pay | Admitting: Women's Health

## 2014-11-03 ENCOUNTER — Other Ambulatory Visit: Payer: Self-pay | Admitting: *Deleted

## 2014-11-03 DIAGNOSIS — F329 Major depressive disorder, single episode, unspecified: Secondary | ICD-10-CM

## 2014-11-03 DIAGNOSIS — F32A Depression, unspecified: Secondary | ICD-10-CM

## 2014-11-03 MED ORDER — DULOXETINE HCL 30 MG PO CPEP: 30.0000 mg | ORAL_CAPSULE | Freq: Every day | ORAL | Status: DC

## 2014-11-03 NOTE — Telephone Encounter (Signed)
Rx refilled until annual.

## 2014-11-03 NOTE — Telephone Encounter (Signed)
Pt has annual in Sept. Asked if you could refill her Cymbalta 30 mg, pt said she is not longer seeing the Doctor that prescribed Rx. Please advise

## 2014-11-03 NOTE — Telephone Encounter (Signed)
Okay for Cymbalta 30 mg by mouth daily with refills through annual exam.

## 2014-12-20 ENCOUNTER — Other Ambulatory Visit: Payer: Self-pay | Admitting: Women's Health

## 2014-12-20 DIAGNOSIS — Z1231 Encounter for screening mammogram for malignant neoplasm of breast: Secondary | ICD-10-CM

## 2014-12-21 ENCOUNTER — Ambulatory Visit (HOSPITAL_COMMUNITY): Payer: No Typology Code available for payment source

## 2014-12-22 ENCOUNTER — Ambulatory Visit (HOSPITAL_COMMUNITY)
Admission: RE | Admit: 2014-12-22 | Discharge: 2014-12-22 | Disposition: A | Discharge status: Home / Self Care | Payer: 59 | Source: Ambulatory Visit | Attending: Women's Health | Admitting: Women's Health

## 2014-12-22 DIAGNOSIS — Z1231 Encounter for screening mammogram for malignant neoplasm of breast: Secondary | ICD-10-CM | POA: Diagnosis present

## 2014-12-23 ENCOUNTER — Encounter: Payer: Self-pay | Admitting: Women's Health

## 2014-12-23 ENCOUNTER — Other Ambulatory Visit (HOSPITAL_COMMUNITY)
Admission: RE | Admit: 2014-12-23 | Discharge: 2014-12-23 | Disposition: A | Discharge status: Home / Self Care | Payer: 59 | Source: Ambulatory Visit | Attending: Women's Health | Admitting: Women's Health

## 2014-12-23 ENCOUNTER — Ambulatory Visit (INDEPENDENT_AMBULATORY_CARE_PROVIDER_SITE_OTHER): Payer: 59 | Admitting: Women's Health

## 2014-12-23 VITALS — BP 118/80 | Ht 66.0 in | Wt 138.0 lb

## 2014-12-23 DIAGNOSIS — Z13820 Encounter for screening for osteoporosis: Secondary | ICD-10-CM

## 2014-12-23 DIAGNOSIS — Z01419 Encounter for gynecological examination (general) (routine) without abnormal findings: Secondary | ICD-10-CM | POA: Insufficient documentation

## 2014-12-23 DIAGNOSIS — Z1151 Encounter for screening for human papillomavirus (HPV): Secondary | ICD-10-CM | POA: Diagnosis not present

## 2014-12-23 LAB — LIPID PANEL
CHOLESTEROL: 190 mg/dL (ref 125–200)
HDL: 46 mg/dL (ref >=46)
LDL Cholesterol: 126 mg/dL (ref <130)
Total CHOL/HDL Ratio: 4.1 Ratio (ref <=5.0)
Triglycerides: 89 mg/dL (ref <150)
VLDL: 18 mg/dL (ref <30)

## 2014-12-23 LAB — COMPREHENSIVE METABOLIC PANEL
ALBUMIN: 4.4 g/dL (ref 3.6–5.1)
ALT: 9 U/L (ref 6–29)
AST: 15 U/L (ref 10–35)
Alkaline Phosphatase: 82 U/L (ref 33–130)
BUN: 15 mg/dL (ref 7–25)
CHLORIDE: 101 mmol/L (ref 98–110)
CO2: 29 mmol/L (ref 20–31)
CREATININE: 0.61 mg/dL (ref 0.50–1.05)
Calcium: 9.6 mg/dL (ref 8.6–10.4)
Glucose, Bld: 69 mg/dL (ref 65–99)
Potassium: 4.5 mmol/L (ref 3.5–5.3)
SODIUM: 138 mmol/L (ref 135–146)
TOTAL PROTEIN: 7.4 g/dL (ref 6.1–8.1)
Total Bilirubin: 0.7 mg/dL (ref 0.2–1.2)

## 2014-12-23 NOTE — Patient Instructions (Signed)
Health Recommendations for Postmenopausal Women Respected and ongoing research has looked at the most common causes of death, disability, and poor quality of life in postmenopausal women. The causes include heart disease, diseases of blood vessels, diabetes, depression, cancer, and bone loss (osteoporosis). Many things can be done to help lower the chances of developing these and other common problems. CARDIOVASCULAR DISEASE Heart Disease: A heart attack is a medical emergency. Know the signs and symptoms of a heart attack. Below are things women can do to reduce their risk for heart disease.   Do not smoke. If you smoke, quit.  Aim for a healthy weight. Being overweight causes many preventable deaths. Eat a healthy and balanced diet and drink an adequate amount of liquids.  Get moving. Make a commitment to be more physically active. Aim for 30 minutes of activity on most, if not all days of the week.  Eat for heart health. Choose a diet that is low in saturated fat and cholesterol and eliminate trans fat. Include whole grains, vegetables, and fruits. Read and understand the labels on food containers before buying.  Know your numbers. Ask your caregiver to check your blood pressure, cholesterol (total, HDL, LDL, triglycerides) and blood glucose. Work with your caregiver on improving your entire clinical picture.  High blood pressure. Limit or stop your table salt intake (try salt substitute and food seasonings). Avoid salty foods and drinks. Read labels on food containers before buying. Eating well and exercising can help control high blood pressure. STROKE  Stroke is a medical emergency. Stroke may be the result of a blood clot in a blood vessel in the brain or by a brain hemorrhage (bleeding). Know the signs and symptoms of a stroke. To lower the risk of developing a stroke:  Avoid fatty foods.  Quit smoking.  Control your diabetes, blood pressure, and irregular heart rate. THROMBOPHLEBITIS  (BLOOD CLOT) OF THE LEG  Becoming overweight and leading a stationary lifestyle may also contribute to developing blood clots. Controlling your diet and exercising will help lower the risk of developing blood clots. CANCER SCREENING  Breast Cancer: Take steps to reduce your risk of breast cancer.  You should practice "breast self-awareness." This means understanding the normal appearance and feel of your breasts and should include breast self-examination. Any changes detected, no matter how small, should be reported to your caregiver.  After age 40, you should have a clinical breast exam (CBE) every year.  Starting at age 40, you should consider having a mammogram (breast X-ray) every year.  If you have a family history of breast cancer, talk to your caregiver about genetic screening.  If you are at high risk for breast cancer, talk to your caregiver about having an MRI and a mammogram every year.  Intestinal or Stomach Cancer: Tests to consider are a rectal exam, fecal occult blood, sigmoidoscopy, and colonoscopy. Women who are high risk may need to be screened at an earlier age and more often.  Cervical Cancer:  Beginning at age 30, you should have a Pap test every 3 years as long as the past 3 Pap tests have been normal.  If you have had past treatment for cervical cancer or a condition that could lead to cancer, you need Pap tests and screening for cancer for at least 20 years after your treatment.  If you had a hysterectomy for a problem that was not cancer or a condition that could lead to cancer, then you no longer need Pap tests.    If you are between ages 65 and 70, and you have had normal Pap tests going back 10 years, you no longer need Pap tests.  If Pap tests have been discontinued, risk factors (such as a new sexual partner) need to be reassessed to determine if screening should be resumed.  Some medical problems can increase the chance of getting cervical cancer. In these  cases, your caregiver may recommend more frequent screening and Pap tests.  Uterine Cancer: If you have vaginal bleeding after reaching menopause, you should notify your caregiver.  Ovarian Cancer: Other than yearly pelvic exams, there are no reliable tests available to screen for ovarian cancer at this time except for yearly pelvic exams.  Lung Cancer: Yearly chest X-rays can detect lung cancer and should be done on high risk women, such as cigarette smokers and women with chronic lung disease (emphysema).  Skin Cancer: A complete body skin exam should be done at your yearly examination. Avoid overexposure to the sun and ultraviolet light lamps. Use a strong sun block cream when in the sun. All of these things are important for lowering the risk of skin cancer. MENOPAUSE Menopause Symptoms: Hormone therapy products are effective for treating symptoms associated with menopause:  Moderate to severe hot flashes.  Night sweats.  Mood swings.  Headaches.  Tiredness.  Loss of sex drive.  Insomnia.  Other symptoms. Hormone replacement carries certain risks, especially in older women. Women who use or are thinking about using estrogen or estrogen with progestin treatments should discuss that with their caregiver. Your caregiver will help you understand the benefits and risks. The ideal dose of hormone replacement therapy is not known. The Food and Drug Administration (FDA) has concluded that hormone therapy should be used only at the lowest doses and for the shortest amount of time to reach treatment goals.  OSTEOPOROSIS Protecting Against Bone Loss and Preventing Fracture If you use hormone therapy for prevention of bone loss (osteoporosis), the risks for bone loss must outweigh the risk of the therapy. Ask your caregiver about other medications known to be safe and effective for preventing bone loss and fractures. To guard against bone loss or fractures, the following is recommended:  If  you are younger than age 50, take 1000 mg of calcium and at least 600 mg of Vitamin D per day.  If you are older than age 50 but younger than age 70, take 1200 mg of calcium and at least 600 mg of Vitamin D per day.  If you are older than age 70, take 1200 mg of calcium and at least 800 mg of Vitamin D per day. Smoking and excessive alcohol intake increases the risk of osteoporosis. Eat foods rich in calcium and vitamin D and do weight bearing exercises several times a week as your caregiver suggests. DIABETES Diabetes Mellitus: If you have type I or type 2 diabetes, you should keep your blood sugar under control with diet, exercise, and recommended medication. Avoid starchy and fatty foods, and too many sweets. Being overweight can make diabetes control more difficult. COGNITION AND MEMORY Cognition and Memory: Menopausal hormone therapy is not recommended for the prevention of cognitive disorders such as Alzheimer's disease or memory loss.  DEPRESSION  Depression may occur at any age, but it is common in elderly women. This may be because of physical, medical, social (loneliness), or financial problems and needs. If you are experiencing depression because of medical problems and control of symptoms, talk to your caregiver about this. Physical   activity and exercise may help with mood and sleep. Community and volunteer involvement may improve your sense of value and worth. If you have depression and you feel that the problem is getting worse or becoming severe, talk to your caregiver about which treatment options are best for you. ACCIDENTS  Accidents are common and can be serious in elderly woman. Prepare your house to prevent accidents. Eliminate throw rugs, place hand bars in bath, shower, and toilet areas. Avoid wearing high heeled shoes or walking on wet, snowy, and icy areas. Limit or stop driving if you have vision or hearing problems, or if you feel you are unsteady with your movements and  reflexes. HEPATITIS C Hepatitis C is a type of viral infection affecting the liver. It is spread mainly through contact with blood from an infected person. It can be treated, but if left untreated, it can lead to severe liver damage over the years. Many people who are infected do not know that the virus is in their blood. If you are a "baby-boomer", it is recommended that you have one screening test for Hepatitis C. IMMUNIZATIONS  Several immunizations are important to consider having during your senior years, including:   Tetanus, diphtheria, and pertussis booster shot.  Influenza every year before the flu season begins.  Pneumonia vaccine.  Shingles vaccine.  Others, as indicated based on your specific needs. Talk to your caregiver about these. Document Released: 05/11/2005 Document Revised: 08/03/2013 Document Reviewed: 01/05/2008 Exit[employer] Patient Information 2015 [employer]. This information is not intended to replace advice given to you by your health care provider. Make sure you discuss any questions you have with your health care provider.  

## 2014-12-23 NOTE — Addendum Note (Signed)
Addended by: Kem Parkinson on: 12/23/2014 09:27 AM   Modules accepted: Orders

## 2014-12-23 NOTE — Progress Notes (Signed)
Monica Hood 01/01/1956 [id number]    History:    Presents for annual exam.  Postmenopausal /no HRT/no bleeding. Not sexually active for years. Normal Pap and mammogram history. 2005 negative colonoscopy. History of depression had been on Cymbalta, not been seen by counselor and unable to get refill, process of scheduling appointment. Mother breast cancer but died from Parkinson. Father dementia.  Past medical history, past surgical history, family history and social history were all reviewed and documented in the EPIC chart. Working part time at a preschool.  ROS:  A ROS was performed and pertinent positives and negatives are included.  Exam:  Filed Vitals:   12/23/14 0758  BP: 118/80    General appearance:  Normal Thyroid:  Symmetrical, normal in size, without palpable masses or nodularity. Respiratory  Auscultation:  Clear without wheezing or rhonchi Cardiovascular  Auscultation:  Regular rate, without rubs, murmurs or gallops  Edema/varicosities:  Not grossly evident Abdominal  Soft,nontender, without masses, guarding or rebound.  Liver/spleen:  No organomegaly noted  Hernia:  None appreciated  Skin  Inspection:  Grossly normal   Breasts: Examined lying and sitting.     Right: Without masses, retractions, discharge or axillary adenopathy.     Left: Without masses, retractions, discharge or axillary adenopathy. Gentitourinary   Inguinal/mons:  Normal without inguinal adenopathy  External genitalia:  Normal  BUS/Urethra/Skene's glands:  Normal  Vagina:  Normal  Cervix:  Normal  Uterus:   normal in size, shape and contour.  Midline and mobile  Adnexa/parametria:     Rt: Without masses or tenderness.   Lt: Without masses or tenderness.  Anus and perineum: Normal  Digital rectal exam: Normal sphincter tone without palpated masses or tenderness  Assessment/Plan:  59 y.o. S WF G0 for annual exam with no complaints.   Postmenopausal/no HRT/no bleeding   Depression-counseling scheduled  Plan: DEXA, will schedule. Home safety, fall prevention and importance of regular exercise reviewed. SBE's, continue annual screening mammogram, 3-D tomography reviewed and encouraged family history and dense breasts. Continue healthy lifestyle, calcium rich diet, vitamin D 1000 daily encouraged. Due for screening colonoscopy instructed to schedule. CBC, CMP, lipid panel, vitamin D, UA, Pap with HR HPV typing, new screening guidelines reviewed.  Harrington Challenger [facility], 8:51 AM 12/23/2014

## 2014-12-24 LAB — URINALYSIS W MICROSCOPIC + REFLEX CULTURE
Bacteria, UA: NONE SEEN [HPF]
Bilirubin Urine: NEGATIVE
CASTS: NONE SEEN [LPF]
Crystals: NONE SEEN [HPF]
GLUCOSE, UA: NEGATIVE
HGB URINE DIPSTICK: NEGATIVE
KETONES UR: NEGATIVE
Nitrite: NEGATIVE
PH: 6 (ref 5.0–8.0)
Protein, ur: NEGATIVE
RBC / HPF: NONE SEEN RBC/HPF (ref <=2)
SQUAMOUS EPITHELIAL / LPF: NONE SEEN [HPF] (ref <=5)
Specific Gravity, Urine: 1.015 (ref 1.001–1.035)
WBC, UA: NONE SEEN WBC/HPF (ref <=5)
Yeast: NONE SEEN [HPF]

## 2014-12-24 LAB — CBC WITH DIFFERENTIAL/PLATELET
BASOS PCT: 1 % (ref 0–1)
Basophils Absolute: 0.1 10*3/uL (ref 0.0–0.1)
Eosinophils Absolute: 0.2 10*3/uL (ref 0.0–0.7)
Eosinophils Relative: 3 % (ref 0–5)
HEMATOCRIT: 44.7 % (ref 36.0–46.0)
HEMOGLOBIN: 15.5 g/dL — AB (ref 12.0–15.0)
LYMPHS PCT: 40 % (ref 12–46)
Lymphs Abs: 3 10*3/uL (ref 0.7–4.0)
MCH: 33.6 pg (ref 26.0–34.0)
MCHC: 34.7 g/dL (ref 30.0–36.0)
MCV: 97 fL (ref 78.0–100.0)
MONOS PCT: 8 % (ref 3–12)
MPV: 9 fL (ref 8.6–12.4)
Monocytes Absolute: 0.6 10*3/uL (ref 0.1–1.0)
NEUTROS ABS: 3.6 10*3/uL (ref 1.7–7.7)
NEUTROS PCT: 48 % (ref 43–77)
Platelets: 266 10*3/uL (ref 150–400)
RBC: 4.61 MIL/uL (ref 3.87–5.11)
RDW: 14 % (ref 11.5–15.5)
WBC: 7.5 10*3/uL (ref 4.0–10.5)

## 2014-12-24 LAB — VITAMIN D 25 HYDROXY (VIT D DEFICIENCY, FRACTURES): Vit D, 25-Hydroxy: 38 ng/mL (ref 30–100)

## 2014-12-25 LAB — URINE CULTURE

## 2014-12-27 LAB — CYTOLOGY - PAP

## 2015-05-04 ENCOUNTER — Encounter: Payer: Self-pay | Admitting: Gastroenterology

## 2015-06-15 ENCOUNTER — Ambulatory Visit (AMBULATORY_SURGERY_CENTER): Payer: Self-pay | Admitting: *Deleted

## 2015-06-15 VITALS — Ht 66.0 in | Wt 146.6 lb

## 2015-06-15 DIAGNOSIS — Z1211 Encounter for screening for malignant neoplasm of colon: Secondary | ICD-10-CM

## 2015-06-15 NOTE — Progress Notes (Signed)
No egg or soy allergy known to patient  No issues with past sedation with any surgeries  or procedures, no intubation problems  No diet pills per patient No home 02 use per patient  No blood thinners per patient  Pt denies issues with constipation  emmi declined   

## 2015-06-16 ENCOUNTER — Encounter: Payer: Self-pay | Admitting: Internal Medicine

## 2015-07-01 ENCOUNTER — Ambulatory Visit (AMBULATORY_SURGERY_CENTER): Payer: BLUE CROSS/BLUE SHIELD | Admitting: Gastroenterology

## 2015-07-01 ENCOUNTER — Encounter: Payer: Self-pay | Admitting: Gastroenterology

## 2015-07-01 VITALS — BP 128/78 | HR 76 | Temp 98.4°F | Resp 14 | Ht 66.0 in | Wt 146.0 lb

## 2015-07-01 DIAGNOSIS — Z1211 Encounter for screening for malignant neoplasm of colon: Secondary | ICD-10-CM

## 2015-07-01 MED ORDER — SODIUM CHLORIDE 0.9 % IV SOLN: 500.0000 mL | INTRAVENOUS | Status: DC

## 2015-07-01 NOTE — Patient Instructions (Signed)
YOU HAD AN ENDOSCOPIC PROCEDURE TODAY AT THE [facility]:   Refer to the procedure report that was given to you for any specific questions about what was found during the examination.  If the procedure report does not answer your questions, please call your gastroenterologist to clarify.  If you requested that your care partner not be given the details of your procedure findings, then the procedure report has been included in a sealed envelope for you to review at your convenience later.  YOU SHOULD EXPECT: Some feelings of bloating in the abdomen. Passage of more gas than usual.  Walking can help get rid of the air that was put into your GI tract during the procedure and reduce the bloating. If you had a lower endoscopy (such as a colonoscopy or flexible sigmoidoscopy) you may notice spotting of blood in your stool or on the toilet paper. If you underwent a bowel prep for your procedure, you may not have a normal bowel movement for a few days.  Please Note:  You might notice some irritation and congestion in your nose or some drainage.  This is from the oxygen used during your procedure.  There is no need for concern and it should clear up in a day or so.  SYMPTOMS TO REPORT IMMEDIATELY:   Following lower endoscopy (colonoscopy or flexible sigmoidoscopy):  Excessive amounts of blood in the stool  Significant tenderness or worsening of abdominal pains  Swelling of the abdomen that is new, acute  Fever of 100F or higher   For urgent or emergent issues, a gastroenterologist can be reached at any hour by calling [phone].   DIET: Your first meal following the procedure should be a small meal and then it is ok to progress to your normal diet. Heavy or fried foods are harder to digest and may make you feel nauseous or bloated.  Likewise, meals heavy in dairy and vegetables can increase bloating.  Drink plenty of fluids but you should avoid alcoholic beverages for 24  hours.  ACTIVITY:  You should plan to take it easy for the rest of today and you should NOT DRIVE or use heavy machinery until tomorrow (because of the sedation medicines used during the test).    FOLLOW UP: Our staff will call the number listed on your records the next business day following your procedure to check on you and address any questions or concerns that you may have regarding the information given to you following your procedure. If we do not reach you, we will leave a message.  However, if you are feeling well and you are not experiencing any problems, there is no need to return our call.  We will assume that you have returned to your regular daily activities without incident.  If any biopsies were taken you will be contacted by phone or by letter within the next 1-3 weeks.  Please call us at [phone] if you have not heard about the biopsies in 3 weeks.    SIGNATURES/CONFIDENTIALITY: You and/or your care partner have signed paperwork which will be entered into your electronic medical record.  These signatures attest to the fact that that the information above on your After Visit Summary has been reviewed and is understood.  Full responsibility of the confidentiality of this discharge information lies with you and/or your care-partner.  Normal colonoscopy.  Repeat 10 years-2027.

## 2015-07-01 NOTE — Progress Notes (Signed)
Patient awakening,vss,report to rn 

## 2015-07-01 NOTE — Op Note (Signed)
[facility] Patient Name: Monica Hood Procedure Date: 07/01/2015 9:43 AM MRN: [id number] Endoscopist: Sherilyn Cooter L. Myrtie Neither , MD Age: 60 Referring MD:  Date of Birth: [birth year] Gender: Female Procedure:                Colonoscopy Indications:              Screening for colorectal malignant neoplasm Medicines:                Monitored Anesthesia Care Procedure:                Pre-Anesthesia Assessment:                           - Prior to the procedure, a History and Physical                            was performed, and patient medications and                            allergies were reviewed. The patient's tolerance of                            previous anesthesia was also reviewed. The risks                            and benefits of the procedure and the sedation                            options and risks were discussed with the patient.                            All questions were answered, and informed consent                            was obtained. Prior Anticoagulants: The patient has                            taken no previous anticoagulant or antiplatelet                            agents. ASA Grade Assessment: II - A patient with                            mild systemic disease. After reviewing the risks                            and benefits, the patient was deemed in                            satisfactory condition to undergo the procedure.                           After obtaining informed consent, the colonoscope  was passed under direct vision. Throughout the                            procedure, the patient's blood pressure, pulse, and                            oxygen saturations were monitored continuously. The                            Model CF-HQ190L [id number]) scope was introduced                            through the anus and advanced to the the cecum,                            identified by appendiceal orifice and  ileocecal                            valve. The colonoscopy was performed without                            difficulty. The patient tolerated the procedure                            well. The quality of the bowel preparation was                            excellent. The ileocecal valve, appendiceal                            orifice, and rectum were photographed. The bowel                            preparation used was Miralax. The quality of the                            bowel preparation was evaluated using the BBPS                            [facility] Bowel Preparation Scale) with scores of:                            Right Colon = 3, Transverse Colon = 3 and Left                            Colon = 3. The total BBPS score equals 9. Scope In: 9:53:26 AM Scope Out: 10:03:52 AM Scope Withdrawal Time: 0 hours 7 minutes 24 seconds  Total Procedure Duration: 0 hours 10 minutes 26 seconds  Findings:      The perianal and digital rectal examinations were normal.      The entire examined colon appeared normal on direct and retroflexion       views. Complications:            No immediate complications. Estimated  Blood Loss:     Estimated blood loss: none. Impression:               - The entire examined colon is normal on direct and                            retroflexion views.                           - No specimens collected. Recommendation:           - Patient has a contact number available for                            emergencies. The signs and symptoms of potential                            delayed complications were discussed with the                            patient. Return to normal activities tomorrow.                            Written discharge instructions were provided to the                            patient.                           - Resume previous diet.                           - Continue present medications.                           - Repeat colonoscopy in 10  years for screening                            purposes. Procedure Code(s):        --- Professional ---                           [id number], Colonoscopy, flexible; diagnostic, including                            collection of specimen(s) by brushing or washing,                            when performed (separate procedure) CPT copyright 2016 American Medical Association. All rights reserved. Krikor Willet L. Myrtie Neitheranis, MD 07/01/2015 10:07:19 AM This report has been signed electronically. Number of Addenda: 0 Referring MD:      Stacie GlazeJohn E. Jenkins

## 2015-07-01 NOTE — Progress Notes (Signed)
Reported to Fanny DanceJanet Mullins, CRNA that pt drank 3 sips of water at 07:15 this am.  Monica Hood

## 2015-07-04 ENCOUNTER — Telehealth: Payer: Self-pay | Admitting: *Deleted

## 2015-07-04 NOTE — Telephone Encounter (Signed)
  Follow up Call-  Call back number 07/01/2015  Post procedure Call Back phone  # #[phone] cell  Permission to leave phone message Yes     Patient questions:  Do you have a fever, pain , or abdominal swelling? No. Pain Score  0 *  Have you tolerated food without any problems? Yes.    Have you been able to return to your normal activities? Yes.    Do you have any questions about your discharge instructions: Diet   No. Medications  No. Follow up visit  No.  Do you have questions or concerns about your Care? No.  Actions: * If pain score is 4 or above: No action needed, pain <4.

## 2015-11-01 ENCOUNTER — Other Ambulatory Visit: Payer: Self-pay | Admitting: Women's Health

## 2015-11-01 DIAGNOSIS — Z13820 Encounter for screening for osteoporosis: Secondary | ICD-10-CM

## 2015-12-06 ENCOUNTER — Other Ambulatory Visit: Payer: Self-pay | Admitting: Gynecology

## 2015-12-06 ENCOUNTER — Ambulatory Visit (INDEPENDENT_AMBULATORY_CARE_PROVIDER_SITE_OTHER): Payer: BLUE CROSS/BLUE SHIELD

## 2015-12-06 DIAGNOSIS — Z13820 Encounter for screening for osteoporosis: Secondary | ICD-10-CM

## 2015-12-27 ENCOUNTER — Ambulatory Visit (INDEPENDENT_AMBULATORY_CARE_PROVIDER_SITE_OTHER): Payer: BLUE CROSS/BLUE SHIELD | Admitting: Women's Health

## 2015-12-27 ENCOUNTER — Encounter: Payer: Self-pay | Admitting: Women's Health

## 2015-12-27 VITALS — BP 120/82 | Ht 66.0 in | Wt 145.0 lb

## 2015-12-27 DIAGNOSIS — Z01419 Encounter for gynecological examination (general) (routine) without abnormal findings: Secondary | ICD-10-CM

## 2015-12-27 DIAGNOSIS — Z13220 Encounter for screening for lipoid disorders: Secondary | ICD-10-CM

## 2015-12-27 DIAGNOSIS — F32A Depression, unspecified: Secondary | ICD-10-CM

## 2015-12-27 DIAGNOSIS — F329 Major depressive disorder, single episode, unspecified: Secondary | ICD-10-CM | POA: Diagnosis not present

## 2015-12-27 LAB — COMPREHENSIVE METABOLIC PANEL
ALBUMIN: 4.4 g/dL (ref 3.6–5.1)
ALT: 8 U/L (ref 6–29)
AST: 15 U/L (ref 10–35)
Alkaline Phosphatase: 68 U/L (ref 33–130)
BUN: 12 mg/dL (ref 7–25)
CALCIUM: 9.4 mg/dL (ref 8.6–10.4)
CHLORIDE: 101 mmol/L (ref 98–110)
CO2: 24 mmol/L (ref 20–31)
CREATININE: 0.72 mg/dL (ref 0.50–1.05)
Glucose, Bld: 83 mg/dL (ref 65–99)
POTASSIUM: 4.2 mmol/L (ref 3.5–5.3)
SODIUM: 136 mmol/L (ref 135–146)
TOTAL PROTEIN: 7.2 g/dL (ref 6.1–8.1)
Total Bilirubin: 0.6 mg/dL (ref 0.2–1.2)

## 2015-12-27 LAB — LIPID PANEL
CHOLESTEROL: 207 mg/dL — AB (ref 125–200)
HDL: 47 mg/dL (ref >=46)
LDL Cholesterol: 140 mg/dL — ABNORMAL HIGH (ref <130)
TRIGLYCERIDES: 101 mg/dL (ref <150)
Total CHOL/HDL Ratio: 4.4 Ratio (ref <=5.0)
VLDL: 20 mg/dL (ref <30)

## 2015-12-27 LAB — CBC WITH DIFFERENTIAL/PLATELET
BASOS PCT: 0 %
Basophils Absolute: 0 cells/uL (ref 0–200)
EOS ABS: 252 {cells}/uL (ref 15–500)
EOS PCT: 4 %
HCT: 41.6 % (ref 35.0–45.0)
Hemoglobin: 13.8 g/dL (ref 11.7–15.5)
LYMPHS PCT: 39 %
Lymphs Abs: 2457 cells/uL (ref 850–3900)
MCH: 32.9 pg (ref 27.0–33.0)
MCHC: 33.2 g/dL (ref 32.0–36.0)
MCV: 99 fL (ref 80.0–100.0)
MONOS PCT: 9 %
MPV: 9.1 fL (ref 7.5–12.5)
Monocytes Absolute: 567 cells/uL (ref 200–950)
Neutro Abs: 3024 cells/uL (ref 1500–7800)
Neutrophils Relative %: 48 %
PLATELETS: 288 10*3/uL (ref 140–400)
RBC: 4.2 MIL/uL (ref 3.80–5.10)
RDW: 14.2 % (ref 11.0–15.0)
WBC: 6.3 10*3/uL (ref 3.8–10.8)

## 2015-12-27 MED ORDER — DULOXETINE HCL 30 MG PO CPEP: 30.0000 mg | ORAL_CAPSULE | Freq: Every day | ORAL | 4 refills | Status: DC

## 2015-12-27 NOTE — Patient Instructions (Signed)

## 2015-12-27 NOTE — Progress Notes (Signed)
Monica Hood [birth year] [id number]    History:    Presents for annual exam.  Post menopausal on no HRT with no bleeding. Is on Cymbalta 30 mg with good relief of depression symptoms. Normal Pap and mammogram history. 06/2015 negative colonoscopy. Mother history of breast cancer but died from Parkinson. Father dementia in long-term care. 06/2015 Dexa -0.8 at hip. Not sexually active  Past medical history, past surgical history, family history and social history were all reviewed and documented in the EPIC chart. 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  ROS:  A ROS was performed and pertinent positives and negatives are included.  Exam:  Vitals:   12/27/15 1428  BP: 120/82  Weight: 145 lb (65.8 kg)  Height: 5\' 6"  (1.676 m)   Body mass index is 23.4 kg/m.   General appearance:  Normal Thyroid:  Symmetrical, normal in size, without palpable masses or nodularity. Respiratory  Auscultation:  Clear without wheezing or rhonchi Cardiovascular  Auscultation:  Regular rate, without rubs, murmurs or gallops  Edema/varicosities:  Not grossly evident Abdominal  Soft,nontender, without masses, guarding or rebound.  Liver/spleen:  No organomegaly noted  Hernia:  None appreciated  Skin  Inspection:  Grossly normal   Breasts: Examined lying and sitting.     Right: Without masses, retractions, discharge or axillary adenopathy.     Left: Without masses, retractions, discharge or axillary adenopathy. Gentitourinary   Inguinal/mons:  Normal without inguinal adenopathy  External genitalia:  Normal  BUS/Urethra/Skene's glands:  Normal  Vagina:  Normal  Cervix:  Normal  Uterus:   normal in size, shape and contour.  Midline and mobile  Adnexa/parametria:     Rt: Without masses or tenderness.   Lt: Without masses or tenderness.  Anus and perineum: Normal  Digital rectal exam: Normal sphincter tone without palpated masses or tenderness  Assessment/Plan:  60 y.o. S WF G0 for  annual exam.   Postmenopausal on no HRT with no bleeding Depression stable on Cymbalta and counseling  Plan: Encouraged to continue counseling. Cymbalta 30 mg by mouth daily prescription, proper use reviewed. Encouraged regular exercise, leisure activities and self-care. SBE's, continue annual mammogram due instructed to schedule. Calcium rich diet and vitamin D 1000 daily encouraged. Home safety, fall prevention discussed. CBC, lipid panel, vitamin D, UA, Pap normal with negative HR HPV 2016, new screening guidelines reviewed.   Harrington ChallengerYOUNG,NANCY J WHNP, 3:21 PM 12/27/2015

## 2015-12-28 ENCOUNTER — Encounter: Payer: Self-pay | Admitting: Women's Health

## 2015-12-28 LAB — VITAMIN D 25 HYDROXY (VIT D DEFICIENCY, FRACTURES): Vit D, 25-Hydroxy: 44 ng/mL (ref 30–100)

## 2016-01-03 ENCOUNTER — Encounter: Payer: Self-pay | Admitting: Women's Health

## 2016-04-16 ENCOUNTER — Other Ambulatory Visit: Payer: Self-pay | Admitting: Women's Health

## 2016-04-16 DIAGNOSIS — Z1231 Encounter for screening mammogram for malignant neoplasm of breast: Secondary | ICD-10-CM

## 2016-05-09 ENCOUNTER — Other Ambulatory Visit: Payer: Self-pay | Admitting: Women's Health

## 2016-05-09 ENCOUNTER — Ambulatory Visit
Admission: RE | Admit: 2016-05-09 | Discharge: 2016-05-09 | Disposition: A | Discharge status: Home / Self Care | Payer: BLUE CROSS/BLUE SHIELD | Source: Ambulatory Visit | Attending: Women's Health | Admitting: Women's Health

## 2016-05-09 DIAGNOSIS — Z1231 Encounter for screening mammogram for malignant neoplasm of breast: Secondary | ICD-10-CM

## 2016-05-11 ENCOUNTER — Other Ambulatory Visit: Payer: Self-pay | Admitting: Women's Health

## 2016-05-11 ENCOUNTER — Telehealth: Payer: Self-pay | Admitting: *Deleted

## 2016-05-11 DIAGNOSIS — R928 Other abnormal and inconclusive findings on diagnostic imaging of breast: Secondary | ICD-10-CM

## 2016-05-11 NOTE — Telephone Encounter (Signed)
Pt received call back from mammogram screening, has to return for diagnostic mammogram, answered all questions for patient. Pt scheduled on 05/15/16

## 2016-05-15 ENCOUNTER — Ambulatory Visit
Admission: RE | Admit: 2016-05-15 | Discharge: 2016-05-15 | Disposition: A | Discharge status: Home / Self Care | Payer: BLUE CROSS/BLUE SHIELD | Source: Ambulatory Visit | Attending: Women's Health | Admitting: Women's Health

## 2016-05-15 DIAGNOSIS — R928 Other abnormal and inconclusive findings on diagnostic imaging of breast: Secondary | ICD-10-CM

## 2016-08-29 ENCOUNTER — Ambulatory Visit: Payer: BLUE CROSS/BLUE SHIELD | Admitting: Family Medicine

## 2016-09-05 ENCOUNTER — Encounter: Payer: Self-pay | Admitting: Family Medicine

## 2016-09-05 ENCOUNTER — Ambulatory Visit (INDEPENDENT_AMBULATORY_CARE_PROVIDER_SITE_OTHER): Payer: BLUE CROSS/BLUE SHIELD | Admitting: Family Medicine

## 2016-09-05 VITALS — BP 110/74 | HR 72 | Resp 12 | Ht 66.0 in | Wt 147.4 lb

## 2016-09-05 DIAGNOSIS — J309 Allergic rhinitis, unspecified: Secondary | ICD-10-CM | POA: Diagnosis not present

## 2016-09-05 DIAGNOSIS — F325 Major depressive disorder, single episode, in full remission: Secondary | ICD-10-CM

## 2016-09-05 DIAGNOSIS — I8390 Asymptomatic varicose veins of unspecified lower extremity: Secondary | ICD-10-CM

## 2016-09-05 DIAGNOSIS — H6983 Other specified disorders of Eustachian tube, bilateral: Secondary | ICD-10-CM | POA: Diagnosis not present

## 2016-09-05 DIAGNOSIS — E785 Hyperlipidemia, unspecified: Secondary | ICD-10-CM | POA: Diagnosis not present

## 2016-09-05 DIAGNOSIS — H6993 Unspecified Eustachian tube disorder, bilateral: Secondary | ICD-10-CM

## 2016-09-05 MED ORDER — FLUTICASONE PROPIONATE 50 MCG/ACT NA SUSP: 2.0000 | Freq: Every day | NASAL | 3 refills | Status: DC

## 2016-09-05 NOTE — Progress Notes (Signed)
HPI:   [name] is a 61 y.o. female, who is here today to establish care.  Former PCP: Dr Lovell SheehanJenkins. Last preventive routine visit: 2013. She follows with gyn regularly, Dr Maple HudsonYoung.  Chronic medical problems: Knee OA, depression, HLD. Dr Maple HudsonYoung prescribes Cymbalta 30 mg, which has helped with depression. Denies current symptoms, denies suicidal thoughts.    Concerns today:   -"Ears bother me", fullness sensation,like she is "in a tunnel." Occasionally "pops" and "it clears." It seems to be exacerbated by travelling. This has been going on for a while. She denies associated fever, chills, sore throat, cough, wheezing, or dyspnea. Intermittent nasal congestion and rhinorrhea.  -"Spots on legs" that somebody call to her attention. She is not sure for how long she has had skin changes, she denies any pain or LE edema.  She drinks alcohol about 4 times per weeks, 12 oz of wine at the time. Former smoker.  She lives alone. She rents past of her house, upstairs, which has different entrance.   Hyperlipidemia:  Currently on non pharmacologic treatment. Following a low fat diet: Yes.   Lab Results  Component Value Date   CHOL 207 (H) 12/27/2015   HDL 47 12/27/2015   LDLCALC 140 (H) 12/27/2015   LDLDIRECT 153.1 07/06/2011   TRIG 101 12/27/2015   CHOLHDL 4.4 12/27/2015     Review of Systems  Constitutional: Negative for activity change, appetite change, fatigue and fever.  HENT: Positive for congestion and rhinorrhea. Negative for dental problem, ear discharge, ear pain, facial swelling, hearing loss, mouth sores, nosebleeds, sore throat and trouble swallowing.   Eyes: Negative for redness and visual disturbance.  Respiratory: Negative for cough, shortness of breath and wheezing.   Cardiovascular: Negative for chest pain, palpitations and leg swelling.  Gastrointestinal: Negative for abdominal pain, nausea and vomiting.       Negative for changes in bowel habits.    Endocrine: Negative for cold intolerance and heat intolerance.  Genitourinary: Negative for decreased urine volume and hematuria.  Musculoskeletal: Positive for arthralgias. Negative for gait problem and joint swelling.  Skin: Negative for rash.  Allergic/Immunologic: Positive for environmental allergies.  Neurological: Negative for dizziness, syncope, weakness and headaches.  Psychiatric/Behavioral: Negative for confusion, sleep disturbance and suicidal ideas. The patient is not nervous/anxious.       Current Outpatient Prescriptions on File Prior to Visit  Medication Sig Dispense Refill  . DULoxetine (CYMBALTA) 30 MG capsule Take 1 capsule (30 mg total) by mouth daily. 90 capsule 4   No current facility-administered medications on file prior to visit.     Past Medical History:  Diagnosis Date  . Arthritis   . Depression   . Hyperlipidemia    past hx of but not now   . Smoker    1/2 pack per week-former smojker 06-15-15   No Known Allergies  Family History  Problem Relation Age of Onset  . Breast cancer Mother   . Parkinsonism Mother 1570       DIED W PARKINSON'S  . Hypertension Father   . Heart disease Father   . Colon polyps Father   . Dementia Father   . Diabetes Brother   . Colon cancer Neg Hx   . Esophageal cancer Neg Hx   . Rectal cancer Neg Hx   . Stomach cancer Neg Hx   . Pancreatic cancer Neg Hx     Social History   Social History  . Marital status: Single  Spouse name: N/A  . Number of children: N/A  . Years of education: N/A   Social History Main Topics  . Smoking status: Former Games developer  . Smokeless tobacco: Never Used  . Alcohol use 1.8 oz/week    3 Glasses of wine per week     Comment: OCC  . Drug use: No  . Sexual activity: No   Other Topics Concern  . None   Social History Narrative  . None    Vitals:   09/05/16 0902  BP: 110/74  Pulse: 72  Resp: 12  O2 sat at RA 97% Body mass index is 23.79 kg/m.  Physical Exam  Nursing  note and vitals reviewed. Constitutional: She is oriented to person, place, and time. She appears well-developed and well-nourished. She does not appear ill. No distress.  HENT:  Head: Atraumatic.  Right Ear: Tympanic membrane, external ear and ear canal normal.  Left Ear: Tympanic membrane, external ear and ear canal normal.  Mouth/Throat: Oropharynx is clear and moist and mucous membranes are normal.  Eyes: Conjunctivae and EOM are normal. Pupils are equal, round, and reactive to light.  I noted that right eye does not blink symmetrically some times.She reports this as her baseline.  Neck: No muscular tenderness present. No tracheal deviation, no edema and no erythema present. No thyromegaly present.  Cardiovascular: Normal rate and regular rhythm.   No murmur heard. Telangiectasis on LE bilateral.  Respiratory: Effort normal and breath sounds normal. No respiratory distress.  GI: Soft. She exhibits no mass. There is no tenderness.  Musculoskeletal: She exhibits no edema or tenderness.  Lymphadenopathy:       Head (right side): No preauricular and no posterior auricular adenopathy present.       Head (left side): No preauricular and no posterior auricular adenopathy present.    She has no cervical adenopathy.  Neurological: She is alert and oriented to person, place, and time. She has normal strength. No cranial nerve deficit. Coordination and gait normal.  Skin: Skin is warm. No rash noted. No erythema.  Hyperpigmentation changes on skin of LE, surrounding spider veins.   Psychiatric: She has a normal mood and affect. Her speech is normal.  Well groomed, good eye contact.     ASSESSMENT AND PLAN:   Monica Hood was seen today for establish care.  Diagnoses and all orders for this visit:  Allergic rhinitis, unspecified seasonality, unspecified trigger  Nasal irrigations as needed. Flonase nasal spray recommended. OTC Allegra 180 mg daily. F/U as needed.  -     fluticasone  (FLONASE) 50 MCG/ACT nasal spray; Place 2 sprays into both nostrils daily.  Varicose vein of leg  Reassured. Educated about physiopathology and prognosis. Asymptomatic.  Eustachian tube dysfunction, bilateral  Autoinflation maneuvers a few times per day as needed. When traveling, chewing gum or eating hard candy may help. OTC decongestants as needed, some side effects discussed. Instructed about warning signs. If persistent ENT consultation can be arranged.  Hyperlipidemia, unspecified hyperlipidemia type  Mild. Continue low fat diet. F/U in a year.  Depression, major, in remission (HCC)  Well controlled with current management. She is not interested in discontinuing medication, so no changes. She is following with Dr Maple Hudson.     Joniya Boberg G. [place], MD  [facility]. [place] office.

## 2016-09-05 NOTE — Patient Instructions (Addendum)
A few things to remember from today's visit:   Allergic rhinitis, unspecified seasonality, unspecified trigger - Plan: fluticasone (FLONASE) 50 MCG/ACT nasal spray  Varicose vein of leg  Eustachian tube dysfunction, bilateral  Hyperlipidemia, unspecified hyperlipidemia type  Popping ears a few times per day.  Flonase may help. Allegra 180 mg daily OR Zyrtec 10 mg.    Please be sure medication list is accurate. If a new problem present, please set up appointment sooner than planned today.

## 2017-02-13 ENCOUNTER — Encounter: Payer: Self-pay | Admitting: Women's Health

## 2017-02-13 ENCOUNTER — Ambulatory Visit (INDEPENDENT_AMBULATORY_CARE_PROVIDER_SITE_OTHER): Payer: BLUE CROSS/BLUE SHIELD | Admitting: Women's Health

## 2017-02-13 VITALS — BP 120/80 | Ht 66.0 in | Wt 150.0 lb

## 2017-02-13 DIAGNOSIS — Z01419 Encounter for gynecological examination (general) (routine) without abnormal findings: Secondary | ICD-10-CM

## 2017-02-13 NOTE — Progress Notes (Signed)
Monica Hood 17-Mar-1956 [id number]    History:    Presents for annual exam.  Postmenopausal/no bleeding, no HRT. Normal Pap and mammogram history. 06/2015 negative colonoscopy. 12/2015 DEXA, -0.8 at the hip. Not sexually active. Currently not taking Cymbalta 30 mg, states she is feeling better, denies depressive symptoms and suicidal ideation.  Past medical history, past surgical history, family history and social history were all reviewed and documented in the EPIC chart. 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 Father in long-term care due to dementia. Exercises every day at gym, includes weightbearing exercises.  ROS:  A ROS was performed and pertinent positives and negatives are included.  Exam:  Vitals:   02/13/17 0847  BP: 120/80  Weight: 150 lb (68 kg)  Height: 5\' 6"  (1.676 m)   Body mass index is 24.21 kg/m.   General appearance:  Normal, appears well Thyroid:  Symmetrical, normal in size, without palpable masses or nodularity. Respiratory  Auscultation:  Clear without wheezing or rhonchi Cardiovascular  Auscultation:  Regular rate, without rubs, murmurs or gallops  Edema/varicosities:  Not grossly evident Abdominal  Soft,nontender, without masses, guarding or rebound.  Liver/spleen:  No organomegaly noted  Hernia:  None appreciated  Skin  Inspection:  Dry but grossly normal   Breasts: Examined lying and sitting.     Right: Without masses, retractions, discharge or axillary adenopathy.     Left: Without masses, retractions, discharge or axillary adenopathy. Gentitourinary   Inguinal/mons:  Normal without inguinal adenopathy  External genitalia:  Normal  BUS/Urethra/Skene's glands:  Normal  Vagina:  Normal  Cervix:  Normal  Uterus:  normal in size, shape and contour.  Midline and mobile  Adnexa/parametria:     Rt: Without masses or tenderness.   Lt: Without masses or tenderness.  Anus and perineum: Normal  Digital rectal exam: Normal sphincter tone without  palpated masses or tenderness  Assessment/Plan:  61 y.o. SWF G0 presents for annual exam with complaints of bilateral osteoarthritic knee pain.     Postmenopausal/no bleeding, no HRT Depression-in remission Hyperlipidemia-diet controlled, primary care manages meds and labs Bilateral osteoarthritic knee pain-planning to see orthopedist later this year  Plan: Has seen a counselor in the past, depression stable, counseled about restarting  counseling and/or Cymbalta if depressive symptoms arise. SBE's, continue annual screening mammogram. 2016 Pap normal with negative HR HPV, new screening guidelines reviewed. Continue current exercise regimen, encouraged calcium rich diet and vitamin D supplement daily. Planning to receive Shingrex vaccine.  Harrington Challengerancy J Young [facility] LLCWHNP, 9:22 AM 02/13/2017

## 2017-02-13 NOTE — Patient Instructions (Signed)
Health Maintenance for Postmenopausal Women Menopause is a normal process in which your reproductive ability comes to an end. This process happens gradually over a span of months to years, usually between the ages of 22 and 9. Menopause is complete when you have missed 12 consecutive menstrual periods. It is important to talk with your health care provider about some of the most common conditions that affect postmenopausal women, such as heart disease, cancer, and bone loss (osteoporosis). Adopting a healthy lifestyle and getting preventive care can help to promote your health and wellness. Those actions can also lower your chances of developing some of these common conditions. What should I know about menopause? During menopause, you may experience a number of symptoms, such as:  Moderate-to-severe hot flashes.  Night sweats.  Decrease in sex drive.  Mood swings.  Headaches.  Tiredness.  Irritability.  Memory problems.  Insomnia.  Choosing to treat or not to treat menopausal changes is an individual decision that you make with your health care provider. What should I know about hormone replacement therapy and supplements? Hormone therapy products are effective for treating symptoms that are associated with menopause, such as hot flashes and night sweats. Hormone replacement carries certain risks, especially as you become older. If you are thinking about using estrogen or estrogen with progestin treatments, discuss the benefits and risks with your health care provider. What should I know about heart disease and stroke? Heart disease, heart attack, and stroke become more likely as you age. This may be due, in part, to the hormonal changes that your body experiences during menopause. These can affect how your body processes dietary fats, triglycerides, and cholesterol. Heart attack and stroke are both medical emergencies. There are many things that you can do to help prevent heart disease  and stroke:  Have your blood pressure checked at least every 1-2 years. High blood pressure causes heart disease and increases the risk of stroke.  If you are 53-22 years old, ask your health care provider if you should take aspirin to prevent a heart attack or a stroke.  Do not use any tobacco products, including cigarettes, chewing tobacco, or electronic cigarettes. If you need help quitting, ask your health care provider.  It is important to eat a healthy diet and maintain a healthy weight. ? Be sure to include plenty of vegetables, fruits, low-fat dairy products, and lean protein. ? Avoid eating foods that are high in solid fats, added sugars, or salt (sodium).  Get regular exercise. This is one of the most important things that you can do for your health. ? Try to exercise for at least 150 minutes each week. The type of exercise that you do should increase your heart rate and make you sweat. This is known as moderate-intensity exercise. ? Try to do strengthening exercises at least twice each week. Do these in addition to the moderate-intensity exercise.  Know your numbers.Ask your health care provider to check your cholesterol and your blood glucose. Continue to have your blood tested as directed by your health care provider.  What should I know about cancer screening? There are several types of cancer. Take the following steps to reduce your risk and to catch any cancer development as early as possible. Breast Cancer  Practice breast self-awareness. ? This means understanding how your breasts normally appear and feel. ? It also means doing regular breast self-exams. Let your health care provider know about any changes, no matter how small.  If you are 40  or older, have a clinician do a breast exam (clinical breast exam or CBE) every year. Depending on your age, family history, and medical history, it may be recommended that you also have a yearly breast X-ray (mammogram).  If you  have a family history of breast cancer, talk with your health care provider about genetic screening.  If you are at high risk for breast cancer, talk with your health care provider about having an MRI and a mammogram every year.  Breast cancer (BRCA) gene test is recommended for women who have family members with BRCA-related cancers. Results of the assessment will determine the need for genetic counseling and BRCA1 and for BRCA2 testing. BRCA-related cancers include these types: ? Breast. This occurs in males or females. ? Ovarian. ? Tubal. This may also be called fallopian tube cancer. ? Cancer of the abdominal or pelvic lining (peritoneal cancer). ? Prostate. ? Pancreatic.  Cervical, Uterine, and Ovarian Cancer Your health care provider may recommend that you be screened regularly for cancer of the pelvic organs. These include your ovaries, uterus, and vagina. This screening involves a pelvic exam, which includes checking for microscopic changes to the surface of your cervix (Pap test).  For women ages 21-65, health care providers may recommend a pelvic exam and a Pap test every three years. For women ages 79-65, they may recommend the Pap test and pelvic exam, combined with testing for human papilloma virus (HPV), every five years. Some types of HPV increase your risk of cervical cancer. Testing for HPV may also be done on women of any age who have unclear Pap test results.  Other health care providers may not recommend any screening for nonpregnant women who are considered low risk for pelvic cancer and have no symptoms. Ask your health care provider if a screening pelvic exam is right for you.  If you have had past treatment for cervical cancer or a condition that could lead to cancer, you need Pap tests and screening for cancer for at least 20 years after your treatment. If Pap tests have been discontinued for you, your risk factors (such as having a new sexual partner) need to be  reassessed to determine if you should start having screenings again. Some women have medical problems that increase the chance of getting cervical cancer. In these cases, your health care provider may recommend that you have screening and Pap tests more often.  If you have a family history of uterine cancer or ovarian cancer, talk with your health care provider about genetic screening.  If you have vaginal bleeding after reaching menopause, tell your health care provider.  There are currently no reliable tests available to screen for ovarian cancer.  Lung Cancer Lung cancer screening is recommended for adults 69-62 years old who are at high risk for lung cancer because of a history of smoking. A yearly low-dose CT scan of the lungs is recommended if you:  Currently smoke.  Have a history of at least 30 pack-years of smoking and you currently smoke or have quit within the past 15 years. A pack-year is smoking an average of one pack of cigarettes per day for one year.  Yearly screening should:  Continue until it has been 15 years since you quit.  Stop if you develop a health problem that would prevent you from having lung cancer treatment.  Colorectal Cancer  This type of cancer can be detected and can often be prevented.  Routine colorectal cancer screening usually begins at  age 42 and continues through age 45.  If you have risk factors for colon cancer, your health care provider may recommend that you be screened at an earlier age.  If you have a family history of colorectal cancer, talk with your health care provider about genetic screening.  Your health care provider may also recommend using home test kits to check for hidden blood in your stool.  A small camera at the end of a tube can be used to examine your colon directly (sigmoidoscopy or colonoscopy). This is done to check for the earliest forms of colorectal cancer.  Direct examination of the colon should be repeated every  5-10 years until age 71. However, if early forms of precancerous polyps or small growths are found or if you have a family history or genetic risk for colorectal cancer, you may need to be screened more often.  Skin Cancer  Check your skin from head to toe regularly.  Monitor any moles. Be sure to tell your health care provider: ? About any new moles or changes in moles, especially if there is a change in a mole's shape or color. ? If you have a mole that is larger than the size of a pencil eraser.  If any of your family members has a history of skin cancer, especially at a young age, talk with your health care provider about genetic screening.  Always use sunscreen. Apply sunscreen liberally and repeatedly throughout the day.  Whenever you are outside, protect yourself by wearing long sleeves, pants, a wide-brimmed hat, and sunglasses.  What should I know about osteoporosis? Osteoporosis is a condition in which bone destruction happens more quickly than new bone creation. After menopause, you may be at an increased risk for osteoporosis. To help prevent osteoporosis or the bone fractures that can happen because of osteoporosis, the following is recommended:  If you are 46-71 years old, get at least 1,000 mg of calcium and at least 600 mg of vitamin D per day.  If you are older than age 55 but younger than age 65, get at least 1,200 mg of calcium and at least 600 mg of vitamin D per day.  If you are older than age 54, get at least 1,200 mg of calcium and at least 800 mg of vitamin D per day.  Smoking and excessive alcohol intake increase the risk of osteoporosis. Eat foods that are rich in calcium and vitamin D, and do weight-bearing exercises several times each week as directed by your health care provider. What should I know about how menopause affects my mental health? Depression may occur at any age, but it is more common as you become older. Common symptoms of depression  include:  Low or sad mood.  Changes in sleep patterns.  Changes in appetite or eating patterns.  Feeling an overall lack of motivation or enjoyment of activities that you previously enjoyed.  Frequent crying spells.  Talk with your health care provider if you think that you are experiencing depression. What should I know about immunizations? It is important that you get and maintain your immunizations. These include:  Tetanus, diphtheria, and pertussis (Tdap) booster vaccine.  Influenza every year before the flu season begins.  Pneumonia vaccine.  Shingles vaccine.  Your health care provider may also recommend other immunizations. This information is not intended to replace advice given to you by your health care provider. Make sure you discuss any questions you have with your health care provider. Document Released: 05/11/2005  Document Revised: 10/07/2015 Document Reviewed: 12/21/2014 Elsevier Interactive Patient Education  2018 [employer]  

## 2017-03-18 ENCOUNTER — Other Ambulatory Visit: Payer: Self-pay

## 2017-03-18 DIAGNOSIS — F329 Major depressive disorder, single episode, unspecified: Secondary | ICD-10-CM

## 2017-03-18 DIAGNOSIS — F32A Depression, unspecified: Secondary | ICD-10-CM

## 2017-03-18 MED ORDER — DULOXETINE HCL 30 MG PO CPEP: 30.0000 mg | ORAL_CAPSULE | Freq: Every day | ORAL | 2 refills | Status: DC

## 2017-06-24 ENCOUNTER — Other Ambulatory Visit: Payer: Self-pay | Admitting: Women's Health

## 2017-06-24 DIAGNOSIS — Z1231 Encounter for screening mammogram for malignant neoplasm of breast: Secondary | ICD-10-CM

## 2017-06-25 ENCOUNTER — Ambulatory Visit
Admission: RE | Admit: 2017-06-25 | Discharge: 2017-06-25 | Disposition: A | Discharge status: Home / Self Care | Payer: BLUE CROSS/BLUE SHIELD | Source: Ambulatory Visit | Attending: Women's Health | Admitting: Women's Health

## 2017-06-25 DIAGNOSIS — Z1231 Encounter for screening mammogram for malignant neoplasm of breast: Secondary | ICD-10-CM

## 2017-07-01 DIAGNOSIS — M179 Osteoarthritis of knee, unspecified: Secondary | ICD-10-CM | POA: Insufficient documentation

## 2017-09-17 ENCOUNTER — Telehealth: Payer: Self-pay | Admitting: Family Medicine

## 2017-09-17 NOTE — Telephone Encounter (Signed)
Copied from CRM [id number]. Topic: Quick Communication - Rx Refill/Question >> Sep 17, 2017  1:31 PM Rica KoyanagiWeikart, Barbee CoughMelissa J wrote: Medication: fluticasone (FLONASE) 50 MCG/ACT nasal spray  Has the patient contacted their pharmacy? Yes.   (Agent: If no, request that the patient contact the pharmacy for the refill.) (Agent: If yes, when and what did the pharmacy advise?)  Preferred Pharmacy (with phone number or street name): [facility] [email or web address]  Pt has said pharm didn't hear back   Agent: Please be advised that RX refills may take up to 3 business days. We ask that you follow-up with your pharmacy.

## 2017-09-18 ENCOUNTER — Other Ambulatory Visit: Payer: Self-pay

## 2017-09-18 DIAGNOSIS — J309 Allergic rhinitis, unspecified: Secondary | ICD-10-CM

## 2017-09-18 MED ORDER — FLUTICASONE PROPIONATE 50 MCG/ACT NA SUSP: 2.0000 | Freq: Every day | NASAL | 3 refills | Status: DC

## 2018-03-03 ENCOUNTER — Ambulatory Visit: Payer: BLUE CROSS/BLUE SHIELD | Admitting: Women's Health

## 2018-03-03 ENCOUNTER — Encounter: Payer: Self-pay | Admitting: Women's Health

## 2018-03-03 VITALS — BP 118/76 | Ht 66.0 in | Wt 156.0 lb

## 2018-03-03 DIAGNOSIS — Z01419 Encounter for gynecological examination (general) (routine) without abnormal findings: Secondary | ICD-10-CM | POA: Diagnosis not present

## 2018-03-03 DIAGNOSIS — F329 Major depressive disorder, single episode, unspecified: Secondary | ICD-10-CM | POA: Diagnosis not present

## 2018-03-03 DIAGNOSIS — Z1151 Encounter for screening for human papillomavirus (HPV): Secondary | ICD-10-CM

## 2018-03-03 DIAGNOSIS — J309 Allergic rhinitis, unspecified: Secondary | ICD-10-CM | POA: Diagnosis not present

## 2018-03-03 DIAGNOSIS — Z13220 Encounter for screening for lipoid disorders: Secondary | ICD-10-CM | POA: Diagnosis not present

## 2018-03-03 DIAGNOSIS — F32A Depression, unspecified: Secondary | ICD-10-CM

## 2018-03-03 LAB — CBC WITH DIFFERENTIAL/PLATELET
BASOS PCT: 1 %
Basophils Absolute: 62 cells/uL (ref 0–200)
Eosinophils Absolute: 229 cells/uL (ref 15–500)
Eosinophils Relative: 3.7 %
HCT: 41 % (ref 35.0–45.0)
HEMOGLOBIN: 13.8 g/dL (ref 11.7–15.5)
LYMPHS ABS: 2344 {cells}/uL (ref 850–3900)
MCH: 33.5 pg — AB (ref 27.0–33.0)
MCHC: 33.7 g/dL (ref 32.0–36.0)
MCV: 99.5 fL (ref 80.0–100.0)
MONOS PCT: 9.2 %
MPV: 9.3 fL (ref 7.5–12.5)
NEUTROS ABS: 2995 {cells}/uL (ref 1500–7800)
Neutrophils Relative %: 48.3 %
Platelets: 273 10*3/uL (ref 140–400)
RBC: 4.12 10*6/uL (ref 3.80–5.10)
RDW: 12.5 % (ref 11.0–15.0)
Total Lymphocyte: 37.8 %
WBC: 6.2 10*3/uL (ref 3.8–10.8)
WBCMIX: 570 {cells}/uL (ref 200–950)

## 2018-03-03 LAB — LIPID PANEL
Cholesterol: 242 mg/dL — ABNORMAL HIGH (ref <200)
HDL: 59 mg/dL (ref >50)
LDL Cholesterol (Calc): 168 mg/dL (calc) — ABNORMAL HIGH
NON-HDL CHOLESTEROL (CALC): 183 mg/dL — AB (ref <130)
Total CHOL/HDL Ratio: 4.1 (calc) (ref <5.0)
Triglycerides: 59 mg/dL (ref <150)

## 2018-03-03 LAB — COMPREHENSIVE METABOLIC PANEL
AG RATIO: 1.6 (calc) (ref 1.0–2.5)
ALT: 10 U/L (ref 6–29)
AST: 17 U/L (ref 10–35)
Albumin: 4.2 g/dL (ref 3.6–5.1)
Alkaline phosphatase (APISO): 80 U/L (ref 33–130)
BUN: 24 mg/dL (ref 7–25)
CHLORIDE: 104 mmol/L (ref 98–110)
CO2: 32 mmol/L (ref 20–32)
Calcium: 9.2 mg/dL (ref 8.6–10.4)
Creat: 0.76 mg/dL (ref 0.50–0.99)
GLOBULIN: 2.6 g/dL (ref 1.9–3.7)
GLUCOSE: 50 mg/dL — AB (ref 65–99)
Potassium: 4 mmol/L (ref 3.5–5.3)
Sodium: 140 mmol/L (ref 135–146)
Total Bilirubin: 0.3 mg/dL (ref 0.2–1.2)
Total Protein: 6.8 g/dL (ref 6.1–8.1)

## 2018-03-03 MED ORDER — DULOXETINE HCL 30 MG PO CPEP: 30.0000 mg | ORAL_CAPSULE | Freq: Every day | ORAL | 4 refills | Status: DC

## 2018-03-03 MED ORDER — FLUTICASONE PROPIONATE 50 MCG/ACT NA SUSP: 2.0000 | Freq: Every day | NASAL | 3 refills | Status: DC

## 2018-03-03 NOTE — Patient Instructions (Addendum)
shingrex vaccine   Red rice yeast daily   Health Maintenance for Postmenopausal Women Menopause is a normal process in which your reproductive ability comes to an end. This process happens gradually over a span of months to years, usually between the ages of 50 and 46. Menopause is complete when you have missed 12 consecutive menstrual periods. It is important to talk with your health care provider about some of the most common conditions that affect postmenopausal women, such as heart disease, cancer, and bone loss (osteoporosis). Adopting a healthy lifestyle and getting preventive care can help to promote your health and wellness. Those actions can also lower your chances of developing some of these common conditions. What should I know about menopause? During menopause, you may experience a number of symptoms, such as:  Moderate-to-severe hot flashes.  Night sweats.  Decrease in sex drive.  Mood swings.  Headaches.  Tiredness.  Irritability.  Memory problems.  Insomnia.  Choosing to treat or not to treat menopausal changes is an individual decision that you make with your health care provider. What should I know about hormone replacement therapy and supplements? Hormone therapy products are effective for treating symptoms that are associated with menopause, such as hot flashes and night sweats. Hormone replacement carries certain risks, especially as you become older. If you are thinking about using estrogen or estrogen with progestin treatments, discuss the benefits and risks with your health care provider. What should I know about heart disease and stroke? Heart disease, heart attack, and stroke become more likely as you age. This may be due, in part, to the hormonal changes that your body experiences during menopause. These can affect how your body processes dietary fats, triglycerides, and cholesterol. Heart attack and stroke are both medical emergencies. There are many things  that you can do to help prevent heart disease and stroke:  Have your blood pressure checked at least every 1-2 years. High blood pressure causes heart disease and increases the risk of stroke.  If you are 29-30 years old, ask your health care provider if you should take aspirin to prevent a heart attack or a stroke.  Do not use any tobacco products, including cigarettes, chewing tobacco, or electronic cigarettes. If you need help quitting, ask your health care provider.  It is important to eat a healthy diet and maintain a healthy weight. ? Be sure to include plenty of vegetables, fruits, low-fat dairy products, and lean protein. ? Avoid eating foods that are high in solid fats, added sugars, or salt (sodium).  Get regular exercise. This is one of the most important things that you can do for your health. ? Try to exercise for at least 150 minutes each week. The type of exercise that you do should increase your heart rate and make you sweat. This is known as moderate-intensity exercise. ? Try to do strengthening exercises at least twice each week. Do these in addition to the moderate-intensity exercise.  Know your numbers.Ask your health care provider to check your cholesterol and your blood glucose. Continue to have your blood tested as directed by your health care provider.  What should I know about cancer screening? There are several types of cancer. Take the following steps to reduce your risk and to catch any cancer development as early as possible. Breast Cancer  Practice breast self-awareness. ? This means understanding how your breasts normally appear and feel. ? It also means doing regular breast self-exams. Let your health care provider know about any  changes, no matter how small.  If you are 38 or older, have a clinician do a breast exam (clinical breast exam or CBE) every year. Depending on your age, family history, and medical history, it may be recommended that you also have  a yearly breast X-ray (mammogram).  If you have a family history of breast cancer, talk with your health care provider about genetic screening.  If you are at high risk for breast cancer, talk with your health care provider about having an MRI and a mammogram every year.  Breast cancer (BRCA) gene test is recommended for women who have family members with BRCA-related cancers. Results of the assessment will determine the need for genetic counseling and BRCA1 and for BRCA2 testing. BRCA-related cancers include these types: ? Breast. This occurs in males or females. ? Ovarian. ? Tubal. This may also be called fallopian tube cancer. ? Cancer of the abdominal or pelvic lining (peritoneal cancer). ? Prostate. ? Pancreatic.  Cervical, Uterine, and Ovarian Cancer Your health care provider may recommend that you be screened regularly for cancer of the pelvic organs. These include your ovaries, uterus, and vagina. This screening involves a pelvic exam, which includes checking for microscopic changes to the surface of your cervix (Pap test).  For women ages 21-65, health care providers may recommend a pelvic exam and a Pap test every three years. For women ages 68-65, they may recommend the Pap test and pelvic exam, combined with testing for human papilloma virus (HPV), every five years. Some types of HPV increase your risk of cervical cancer. Testing for HPV may also be done on women of any age who have unclear Pap test results.  Other health care providers may not recommend any screening for nonpregnant women who are considered low risk for pelvic cancer and have no symptoms. Ask your health care provider if a screening pelvic exam is right for you.  If you have had past treatment for cervical cancer or a condition that could lead to cancer, you need Pap tests and screening for cancer for at least 20 years after your treatment. If Pap tests have been discontinued for you, your risk factors (such as  having a new sexual partner) need to be reassessed to determine if you should start having screenings again. Some women have medical problems that increase the chance of getting cervical cancer. In these cases, your health care provider may recommend that you have screening and Pap tests more often.  If you have a family history of uterine cancer or ovarian cancer, talk with your health care provider about genetic screening.  If you have vaginal bleeding after reaching menopause, tell your health care provider.  There are currently no reliable tests available to screen for ovarian cancer.  Lung Cancer Lung cancer screening is recommended for adults 87-80 years old who are at high risk for lung cancer because of a history of smoking. A yearly low-dose CT scan of the lungs is recommended if you:  Currently smoke.  Have a history of at least 30 pack-years of smoking and you currently smoke or have quit within the past 15 years. A pack-year is smoking an average of one pack of cigarettes per day for one year.  Yearly screening should:  Continue until it has been 15 years since you quit.  Stop if you develop a health problem that would prevent you from having lung cancer treatment.  Colorectal Cancer  This type of cancer can be detected and can often  be prevented.  Routine colorectal cancer screening usually begins at age 55 and continues through age 23.  If you have risk factors for colon cancer, your health care provider may recommend that you be screened at an earlier age.  If you have a family history of colorectal cancer, talk with your health care provider about genetic screening.  Your health care provider may also recommend using home test kits to check for hidden blood in your stool.  A small camera at the end of a tube can be used to examine your colon directly (sigmoidoscopy or colonoscopy). This is done to check for the earliest forms of colorectal cancer.  Direct  examination of the colon should be repeated every 5-10 years until age 82. However, if early forms of precancerous polyps or small growths are found or if you have a family history or genetic risk for colorectal cancer, you may need to be screened more often.  Skin Cancer  Check your skin from head to toe regularly.  Monitor any moles. Be sure to tell your health care provider: ? About any new moles or changes in moles, especially if there is a change in a mole's shape or color. ? If you have a mole that is larger than the size of a pencil eraser.  If any of your family members has a history of skin cancer, especially at a Gissela Bloch age, talk with your health care provider about genetic screening.  Always use sunscreen. Apply sunscreen liberally and repeatedly throughout the day.  Whenever you are outside, protect yourself by wearing long sleeves, pants, a wide-brimmed hat, and sunglasses.  What should I know about osteoporosis? Osteoporosis is a condition in which bone destruction happens more quickly than new bone creation. After menopause, you may be at an increased risk for osteoporosis. To help prevent osteoporosis or the bone fractures that can happen because of osteoporosis, the following is recommended:  If you are 53-79 years old, get at least 1,000 mg of calcium and at least 600 mg of vitamin D per day.  If you are older than age 60 but younger than age 20, get at least 1,200 mg of calcium and at least 600 mg of vitamin D per day.  If you are older than age 25, get at least 1,200 mg of calcium and at least 800 mg of vitamin D per day.  Smoking and excessive alcohol intake increase the risk of osteoporosis. Eat foods that are rich in calcium and vitamin D, and do weight-bearing exercises several times each week as directed by your health care provider. What should I know about how menopause affects my mental health? Depression may occur at any age, but it is more common as you become  older. Common symptoms of depression include:  Low or sad mood.  Changes in sleep patterns.  Changes in appetite or eating patterns.  Feeling an overall lack of motivation or enjoyment of activities that you previously enjoyed.  Frequent crying spells.  Talk with your health care provider if you think that you are experiencing depression. What should I know about immunizations? It is important that you get and maintain your immunizations. These include:  Tetanus, diphtheria, and pertussis (Tdap) booster vaccine.  Influenza every year before the flu season begins.  Pneumonia vaccine.  Shingles vaccine.  Your health care provider may also recommend other immunizations. This information is not intended to replace advice given to you by your health care provider. Make sure you discuss any questions  you have with your health care provider. Document Released: 05/11/2005 Document Revised: 10/07/2015 Document Reviewed: 12/21/2014 Elsevier Interactive Patient Education  2018 [employer].

## 2018-03-03 NOTE — Progress Notes (Signed)
Monica Hood 1956/02/24 [id number]    History:    Presents for annual exam.  Postmenopausal on no HRT with no bleeding.  Normal Pap and mammogram history.   2017 normal DEXA.  2017- colonoscopy.   Past medical history, past surgical history, family history and social history were all reviewed and documented in the EPIC chart.  Works part-time..  Father deceased this past year had been in long-term care for over 3 years, dementia.  ROS:  A ROS was performed and pertinent positives and negatives are included.  Exam:  Vitals:   03/03/18 0829  BP: 118/76  Weight: 156 lb (70.8 kg)  Height: 5\' 6"  (1.676 m)   Body mass index is 25.18 kg/m.   General appearance:  Normal Thyroid:  Symmetrical, normal in size, without palpable masses or nodularity. Respiratory  Auscultation:  Clear without wheezing or rhonchi Cardiovascular  Auscultation:  Regular rate, without rubs, murmurs or gallops  Edema/varicosities:  Not grossly evident Abdominal  Soft,nontender, without masses, guarding or rebound.  Liver/spleen:  No organomegaly noted  Hernia:  None appreciated  Skin  Inspection:  Grossly normal   Breasts: Examined lying and sitting.     Right: Without masses, retractions, discharge or axillary adenopathy.     Left: Without masses, retractions, discharge or axillary adenopathy. Gentitourinary   Inguinal/mons:  Normal without inguinal adenopathy  External genitalia:  Normal  BUS/Urethra/Skene's glands:  Normal  Vagina:  Normal  Cervix:  Normal  Uterus:  normal in size, shape and contour.  Midline and mobile  Adnexa/parametria:     Rt: Without masses or tenderness.   Lt: Without masses or tenderness.  Anus and perineum: Normal  Digital rectal exam: Normal sphincter tone without palpated masses or tenderness  Assessment/Plan:  62 y.o. SWF G1, P0 for annual exam with no complaints.  Post menopausal on no HRT with no bleeding Depression stable on Cymbalta  Plan: Cymbalta 30 mg p.o.  daily prescription, proper use given and reviewed, reviewed importance of self-care of leisure, exercise and counseling as needed.  SBE's, continue annual screening mammogram, calcium rich foods, MND 2000 daily , history of an elevated cholesterol red rice yeast supplement encouraged.  Shingrex reviewed and encouraged.  CBC, lipid panel, CMP, Pap with HR HPV typing, new screening guidelines reviewed.  Harrington Challengerancy J  [facility] NorthWHNP, 8:47 AM 03/03/2018

## 2018-03-03 NOTE — Addendum Note (Signed)
Addended by: Dayna BarkerGARDNER, KIMBERLY K on: 03/03/2018 09:31 AM   Modules accepted: Orders

## 2018-03-04 LAB — PAP, TP IMAGING W/ HPV RNA, RFLX HPV TYPE 16,18/45: HPV DNA HIGH RISK: NOT DETECTED

## 2018-04-08 ENCOUNTER — Other Ambulatory Visit: Payer: Self-pay | Admitting: Women's Health

## 2018-04-08 DIAGNOSIS — F32A Depression, unspecified: Secondary | ICD-10-CM

## 2018-04-08 DIAGNOSIS — F329 Major depressive disorder, single episode, unspecified: Secondary | ICD-10-CM

## 2018-12-30 ENCOUNTER — Encounter: Payer: Self-pay | Admitting: Gynecology

## 2019-01-15 ENCOUNTER — Other Ambulatory Visit: Payer: Self-pay

## 2019-01-15 DIAGNOSIS — Z20822 Contact with and (suspected) exposure to covid-19: Secondary | ICD-10-CM

## 2019-01-16 LAB — NOVEL CORONAVIRUS, NAA: SARS-CoV-2, NAA: NOT DETECTED

## 2020-08-05 ENCOUNTER — Telehealth: Payer: Self-pay

## 2020-08-05 NOTE — Telephone Encounter (Signed)
The patient is wanting to know if she can re-establish care with Dr. Swaziland?   She changed insurance and now she can see Dr. Swaziland again.  Can I schedule?

## 2020-08-08 NOTE — Telephone Encounter (Signed)
LMVM for the patient to contact the office to schedule a new patient appointment with Dr. Jordan 

## 2020-08-08 NOTE — Telephone Encounter (Signed)
Okay to schedule

## 2020-08-08 NOTE — Telephone Encounter (Signed)
LMVM for the patient to contact the office to schedule a New Patient appointment with Dr. Swaziland.

## 2020-08-24 ENCOUNTER — Ambulatory Visit (INDEPENDENT_AMBULATORY_CARE_PROVIDER_SITE_OTHER): Payer: 59 | Admitting: Family Medicine

## 2020-08-24 ENCOUNTER — Encounter: Payer: Self-pay | Admitting: Family Medicine

## 2020-08-24 ENCOUNTER — Other Ambulatory Visit: Payer: Self-pay

## 2020-08-24 VITALS — BP 128/80 | HR 85 | Resp 16 | Ht 66.0 in | Wt 159.1 lb

## 2020-08-24 DIAGNOSIS — Z1159 Encounter for screening for other viral diseases: Secondary | ICD-10-CM

## 2020-08-24 DIAGNOSIS — F32A Depression, unspecified: Secondary | ICD-10-CM

## 2020-08-24 DIAGNOSIS — I8393 Asymptomatic varicose veins of bilateral lower extremities: Secondary | ICD-10-CM

## 2020-08-24 DIAGNOSIS — M170 Bilateral primary osteoarthritis of knee: Secondary | ICD-10-CM | POA: Diagnosis not present

## 2020-08-24 DIAGNOSIS — E785 Hyperlipidemia, unspecified: Secondary | ICD-10-CM | POA: Diagnosis not present

## 2020-08-24 DIAGNOSIS — F325 Major depressive disorder, single episode, in full remission: Secondary | ICD-10-CM

## 2020-08-24 DIAGNOSIS — S99922A Unspecified injury of left foot, initial encounter: Secondary | ICD-10-CM

## 2020-08-24 MED ORDER — MELOXICAM 7.5 MG PO TABS
7.5000 mg | ORAL_TABLET | Freq: Every day | ORAL | 3 refills | Status: DC
Start: 2020-08-24 — End: 2021-06-07

## 2020-08-24 MED ORDER — DULOXETINE HCL 30 MG PO CPEP: 30.0000 mg | ORAL_CAPSULE | Freq: Every day | ORAL | 3 refills | Status: DC

## 2020-08-24 MED ORDER — ATORVASTATIN CALCIUM 10 MG PO TABS
1.0000 | ORAL_TABLET | Freq: Every day | ORAL | 3 refills | Status: DC
Start: 2020-08-24 — End: 2021-09-02

## 2020-08-24 NOTE — Progress Notes (Signed)
HPI: [name] is a 65 y.o. female, who is here today to re-establish care.  Former PCP: Monica PilotKristy Rogers, PA, who she cannot continue seeing because health insurance changes. Last preventive routine visit: 06/02/19.  Health Maintenance Status  Date Due Completion Dates  MAMMOGRAM 11/27/2019 11/27/2018, 06/25/2017 (Done)  PAP SMEAR 03/17/2023 03/16/2018 (Done)  COLONOSCOPY 06/30/2025 07/01/2015 (Done)   Chronic medical problems: Hyperlipidemia, depression, allergic rhinitis, and OA. No Hx of HTN or diabetes.  Knee OA: Problem has been going on for about 5 years. Achy like pain. At some point TKR was recommended. She is on Meloxicam 7.5 mg daily prn. Medication helps, no significant pain when she takes it, so she is doing so daily. Negative for joint edema or erythema. No hx of trauma. Evaluated by ortho in the past.  Depression: She is on Cymbalta 30 mg daily.She has been on this medication since her early 6630's. Negative for depressed mood.  HLD: She has been on Atorvastatin 10 mg daily for about 9 months. She has tolerated medication well.   Ref Range & Units 7 mo ago Comments  LDL Direct <130 mg/dL [id number]    Total Cholesterol 25 - 199 MG/DL [id number]    Triglycerides 10 - 150 MG/DL 98    HDL Cholesterol 35 - 135 MG/DL 48    Total Chol / HDL Cholesterol <4.5 5.0High    Non-HDL Cholesterol MG/DL [id number]       Ref Range & Units 7 mo ago Comments  Sodium 135 - 146 MMOL/L 140    Potassium 3.5 - 5.3 MMOL/L 4.2    Chloride 98 - 110 MMOL/L 103    CO2 23 - 30 MMOL/L 30    BUN 8 - 24 MG/DL 17    Glucose 70 - 99 MG/DL 77  Patients taking eltrombopag at doses >/= 100 mg daily may show falsely elevated values of 10% or greater.  Creatinine 0.50 - 1.50 MG/DL 5.620.59    Calcium 8.5 - 13.010.5 MG/DL 9.2    Total Protein 6.0 - 8.3 G/DL 7.3  Patients taking eltrombopag at doses >/= 100 mg daily may show falsely elevated values of 10% or greater.  Albumin  3.5 - 5.0 G/DL 4.4    Total  Bilirubin 0.1 - 1.2 MG/DL 0.5  Patients taking eltrombopag at doses >/= 100 mg daily may show falsely elevated values of 10% or greater.  Alkaline Phosphatase 25 - 125 IU/L or U/L 86    AST (SGOT) 5 - 40 IU/L or U/L 16    ALT (SGPT) 5 - 50 IU/L or U/L 10    Anion Gap 4 - 14 MMOL/L 7    Est. GFR Non-African American >=60 ML/MIN/1.73 M*2 ML/MIN/1.73 M*2 >=90      Pigmentation changes on LE's and spider veins, she has had problem for a while. She has not identified exacerbating or alleviating factors. No pain or pruritus. She has an appt with dermatologist in 12/2020.  Left foot pain after dropping something on it about 3-4 weeks. 4th toe edema and bruise.Problem has greatly improved. No deformity or cyanosis.  Review of Systems  Constitutional: Negative for activity change, appetite change, fatigue and fever.  HENT: Negative for mouth sores, nosebleeds and sore throat.   Eyes: Negative for redness and visual disturbance.  Respiratory: Negative for cough, shortness of breath and wheezing.   Cardiovascular: Negative for chest pain, palpitations and leg swelling.  Gastrointestinal: Negative for abdominal pain, nausea and vomiting.  Negative for changes in bowel habits.  Genitourinary: Negative for decreased urine volume, dysuria and hematuria.  Musculoskeletal: Positive for arthralgias. Negative for gait problem.  Skin: Negative for wound.  Neurological: Negative for syncope, weakness and headaches.  Rest see pertinent positives and negatives per HPI.  No current outpatient medications on file prior to visit.   No current facility-administered medications on file prior to visit.   Past Medical History:  Diagnosis Date  . Arthritis   . Depression   . Hyperlipidemia    past hx of but not now   . Smoker    1/2 pack per week-former smojker 06-15-15   No Known Allergies  Family History  Problem Relation Age of Onset  . Breast cancer Mother   . Parkinsonism Mother 25        DIED W [facility]  . Hypertension Father   . Heart disease Father   . Colon polyps Father   . Dementia Father   . Diabetes Brother   . Colon cancer Neg Hx   . Esophageal cancer Neg Hx   . Rectal cancer Neg Hx   . Stomach cancer Neg Hx   . Pancreatic cancer Neg Hx     Social History   Socioeconomic History  . Marital status: Single    Spouse name: Not on file  . Number of children: Not on file  . Years of education: Not on file  . Highest education level: Not on file  Occupational History  . Not on file  Tobacco Use  . Smoking status: Former Games developer  . Smokeless tobacco: Never Used  Vaping Use  . Vaping Use: Never used  Substance and Sexual Activity  . Alcohol use: Yes    Alcohol/week: 6.0 standard drinks    Types: 6 Glasses of wine per week    Comment: OCC  . Drug use: No  . Sexual activity: Not Currently    Comment: 1st intercourse 18 yo-5 partners  Other Topics Concern  . Not on file  Social History Narrative  . Not on file   Social Determinants of Health   Financial Resource Strain: Not on file  Food Insecurity: Not on file  Transportation Needs: Not on file  Physical Activity: Not on file  Stress: Not on file  Social Connections: Not on file   Vitals:   08/24/20 1350  BP: 128/80  Pulse: 85  Resp: 16  SpO2: 98%   Body mass index is 25.68 kg/m.  Physical Exam Vitals and nursing note reviewed.  Constitutional:      General: She is not in acute distress.    Appearance: She is well-developed.  HENT:     Head: Normocephalic and atraumatic.     Mouth/Throat:     Mouth: Mucous membranes are moist.     Pharynx: Oropharynx is clear.  Eyes:     Conjunctiva/sclera: Conjunctivae normal.  Cardiovascular:     Rate and Rhythm: Normal rate and regular rhythm.     Pulses:          Dorsalis pedis pulses are 2+ on the right side and 2+ on the left side.     Heart sounds: No murmur heard.   Pulmonary:     Effort: Pulmonary effort is normal. No  respiratory distress.     Breath sounds: Normal breath sounds.  Abdominal:     Palpations: Abdomen is soft. There is no hepatomegaly or mass.     Tenderness: There is no abdominal tenderness.  Musculoskeletal:  Right knee: Crepitus present. No erythema. No tenderness.     Left knee: Crepitus present. No erythema. No tenderness.     Comments: Left 4th toe with mild edema, no deformity and mild tenderness with passive ROM. No pain elicited with vibration.  Lymphadenopathy:     Cervical: No cervical adenopathy.  Skin:    General: Skin is warm.     Findings: No erythema or rash.     Comments: Distal LE's with mild hyperpigmented macular lesions around varicose veins, bilateral.  Neurological:     General: No focal deficit present.     Mental Status: She is alert and oriented to person, place, and time.     Cranial Nerves: No cranial nerve deficit.  Psychiatric:     Comments: Well groomed, good eye contact.   ASSESSMENT AND PLAN:  [name] was seen today for establish care.  Diagnoses and all orders for this visit:  Orders Placed This Encounter  Procedures  . Basic metabolic panel  . Lipid panel  . Hepatitis C antibody  . Ambulatory referral to Vascular Surgery   Lab Results  Component Value Date   CHOL 206 (H) 08/25/2020   HDL 54.90 08/25/2020   LDLCALC 134 (H) 08/25/2020   LDLDIRECT 153.1 07/06/2011   TRIG 84.0 08/25/2020   CHOLHDL 4 08/25/2020   Lab Results  Component Value Date   CREATININE 0.61 08/25/2020   BUN 25 (H) 08/25/2020   NA 141 08/25/2020   K 4.2 08/25/2020   CL 103 08/25/2020   CO2 30 08/25/2020   The 10-year ASCVD risk score Denman George DC Jr., et al., 2013) is: 4.6%   Values used to calculate the score:     Age: 86 years     Sex: Female     Is Non-Hispanic African American: No     Diabetic: No     Tobacco smoker: No     Systolic Blood Pressure: 128 mmHg     Is BP treated: No     HDL Cholesterol: 54.9 mg/dL     Total Cholesterol: 206  mg/dL  Varicose veins of both lower extremities, unspecified whether complicated We discussed Dx,prognosis,and treatment options. Skin pigmentation (post inflammatory) changes seem related with this problem. She would like to consider treatment, so vascular referral placed.  Osteoarthritis of both knees, unspecified osteoarthritis type Pain is well controlled with Meloxicam. We discussed some side effects of chronic NSAID's use.  -     meloxicam (MOBIC) 7.5 MG tablet; Take 1 tablet (7.5 mg total) by mouth daily.  Encounter for HCV screening test for low risk patient -     Hepatitis C antibody; Future  Hyperlipidemia, unspecified hyperlipidemia type Continue same dose of Atorvastatin. Further recommendations according to lipid panel results.  -     atorvastatin (LIPITOR) 10 MG tablet; Take 1 tablet (10 mg total) by mouth daily.  Foot trauma, left, initial encounter Pain is improving. We do not have X ray service today, she will need to go to [facility].She refers to hold on imaging for now.  Depression, major, in remission (HCC) Problem has been well controlled. Continue Duloxetine same dose, wioch also may help with knee OA.  -     DULoxetine (CYMBALTA) 30 MG capsule; Take 1 capsule (30 mg total) by mouth daily.  She would like to follow annually, which is appropriate as far as problems are stable. Return in about 1 year (around 08/24/2021) for cpe and f/u.  Jaylaa Gallion G. [place], MD  [facility].  [place] office.   A few things to remember from today's visit:   Varicose veins of both lower extremities, unspecified whether complicated - Plan: Ambulatory referral to Vascular Surgery  Osteoarthritis of both knees, unspecified osteoarthritis type - Plan: Basic metabolic panel  Depression - Plan: DULoxetine (CYMBALTA) 30 MG capsule  Encounter for HCV screening test for low risk patient - Plan: Hepatitis C antibody  Hyperlipidemia, unspecified hyperlipidemia type - Plan:  Lipid panel  If you need refills please call your pharmacy. Do not use My Chart to request refills or for acute issues that need immediate attention.   No changes in current medications. Labs tomorrow.  Please be sure medication list is accurate. If a new problem present, please set up appointment sooner than planned today.

## 2020-08-24 NOTE — Patient Instructions (Addendum)
A few things to remember from today's visit:   Varicose veins of both lower extremities, unspecified whether complicated - Plan: Ambulatory referral to Vascular Surgery  Osteoarthritis of both knees, unspecified osteoarthritis type - Plan: Basic metabolic panel  Depression - Plan: DULoxetine (CYMBALTA) 30 MG capsule  Encounter for HCV screening test for low risk patient - Plan: Hepatitis C antibody  Hyperlipidemia, unspecified hyperlipidemia type - Plan: Lipid panel  If you need refills please call your pharmacy. Do not use My Chart to request refills or for acute issues that need immediate attention.   No changes in current medications. Labs tomorrow.  Please be sure medication list is accurate. If a new problem present, please set up appointment sooner than planned today.

## 2020-08-25 ENCOUNTER — Other Ambulatory Visit (INDEPENDENT_AMBULATORY_CARE_PROVIDER_SITE_OTHER): Payer: 59

## 2020-08-25 DIAGNOSIS — Z1159 Encounter for screening for other viral diseases: Secondary | ICD-10-CM

## 2020-08-25 DIAGNOSIS — M170 Bilateral primary osteoarthritis of knee: Secondary | ICD-10-CM | POA: Diagnosis not present

## 2020-08-25 DIAGNOSIS — E785 Hyperlipidemia, unspecified: Secondary | ICD-10-CM

## 2020-08-25 LAB — LIPID PANEL
Cholesterol: 206 mg/dL — ABNORMAL HIGH (ref 0–200)
HDL: 54.9 mg/dL (ref >39.00)
LDL Cholesterol: 134 mg/dL — ABNORMAL HIGH (ref 0–99)
NonHDL: 151.01
Total CHOL/HDL Ratio: 4
Triglycerides: 84 mg/dL (ref 0.0–149.0)
VLDL: 16.8 mg/dL (ref 0.0–40.0)

## 2020-08-25 LAB — BASIC METABOLIC PANEL
BUN: 25 mg/dL — ABNORMAL HIGH (ref 6–23)
CO2: 30 mEq/L (ref 19–32)
Calcium: 9.2 mg/dL (ref 8.4–10.5)
Chloride: 103 mEq/L (ref 96–112)
Creatinine, Ser: 0.61 mg/dL (ref 0.40–1.20)
GFR: 95.37 mL/min (ref >60.00)
Glucose, Bld: 84 mg/dL (ref 70–99)
Potassium: 4.2 mEq/L (ref 3.5–5.1)
Sodium: 141 mEq/L (ref 135–145)

## 2020-08-26 LAB — HEPATITIS C ANTIBODY
Hepatitis C Ab: NONREACTIVE
SIGNAL TO CUT-OFF: 0.63 (ref <1.00)

## 2020-09-09 ENCOUNTER — Other Ambulatory Visit: Payer: Self-pay | Admitting: *Deleted

## 2020-09-09 DIAGNOSIS — I83893 Varicose veins of bilateral lower extremities with other complications: Secondary | ICD-10-CM

## 2020-09-22 ENCOUNTER — Telehealth: Payer: Self-pay | Admitting: Family Medicine

## 2020-09-22 NOTE — Telephone Encounter (Signed)
I spoke with the pt and she stated that she has been having stiffness in her knees due to her arthritis. Pt reported that she is currently taking Meloxicam 7.5 mg and would like to switch to another medication. Please advise.

## 2020-09-22 NOTE — Telephone Encounter (Signed)
Pt call and stated she want a call back about her knee.

## 2020-09-23 ENCOUNTER — Telehealth: Payer: Self-pay | Admitting: Family Medicine

## 2020-09-23 NOTE — Telephone Encounter (Signed)
Left a message for the pt to return my call.  

## 2020-09-23 NOTE — Telephone Encounter (Signed)
Please refer to prior telephone note

## 2020-09-23 NOTE — Telephone Encounter (Signed)
If medication is not helping, she can try OTC Naproxen 220 mg bid prn and stop Meloxicam. Ortho evaluation can also be considered. Thanks, BJ

## 2020-09-23 NOTE — Telephone Encounter (Signed)
Pt call and stated she is waiting for a call back to tell her what to do see message.

## 2020-09-23 NOTE — Telephone Encounter (Signed)
Patient informed of the message below.

## 2020-10-05 ENCOUNTER — Ambulatory Visit (HOSPITAL_COMMUNITY)
Admission: RE | Admit: 2020-10-05 | Discharge: 2020-10-05 | Disposition: A | Discharge status: Home / Self Care | Payer: 59 | Source: Ambulatory Visit | Attending: Vascular Surgery | Admitting: Vascular Surgery

## 2020-10-05 ENCOUNTER — Ambulatory Visit: Payer: 59 | Admitting: Physician Assistant

## 2020-10-05 ENCOUNTER — Other Ambulatory Visit: Payer: Self-pay

## 2020-10-05 VITALS — BP 122/79 | HR 71 | Temp 97.9°F | Resp 20 | Ht 66.0 in | Wt 156.1 lb

## 2020-10-05 DIAGNOSIS — I8393 Asymptomatic varicose veins of bilateral lower extremities: Secondary | ICD-10-CM

## 2020-10-05 DIAGNOSIS — I872 Venous insufficiency (chronic) (peripheral): Secondary | ICD-10-CM

## 2020-10-05 DIAGNOSIS — I83893 Varicose veins of bilateral lower extremities with other complications: Secondary | ICD-10-CM | POA: Diagnosis present

## 2020-10-05 NOTE — Progress Notes (Signed)
Requested by:  [place], Betty G, MD [address] [place],  [place] [address]  Reason for consultation: Discolored skin bilateral lower extremities   History of Present Illness   Monica Hood is a 65 y.o. (1955/10/26) female who presents for evaluation of pigment changes of anterior bilateral lower legs.  She states these areas began as reddened areas and progressed to a more brown discoloration.  Denies lower extremity pain, claudication, rest pain or edema.  She has had no prior venous intervention or history of DVT.  She has history of bilateral knee stiffness.  Venous symptoms include: positive if (X) [  ] aching [  ] heavy [  ] tired  [  ] throbbing [  ] burning  [  ] itching [  ]swelling [  ] bleeding [  ] ulcer  Onset/duration:  years  Occupation:  part-time Education officer, environmental Aggravating factors: none Alleviating factors: none Compression:  no Helps:   Pain medications:  Mobic for knee pain Previous vein procedures:  no History of DVT:  no  Past Medical History:  Diagnosis Date   Arthritis    Depression    Hyperlipidemia    past hx of but not now    Smoker    1/2 pack per week-former smojker 06-15-15    Past Surgical History:  Procedure Laterality Date   COLONOSCOPY     last colon 17 years ago normal per pt   DENTAL SURGERY      Social History   Socioeconomic History   Marital status: Single    Spouse name: Not on file   Number of children: Not on file   Years of education: Not on file   Highest education level: Not on file  Occupational History   Not on file  Tobacco Use   Smoking status: Former    Pack years: 0.00   Smokeless tobacco: Never  Vaping Use   Vaping Use: Never used  Substance and Sexual Activity   Alcohol use: Yes    Alcohol/week: 6.0 standard drinks    Types: 6 Glasses of wine per week    Comment: OCC   Drug use: No   Sexual activity: Not Currently    Comment: 1st intercourse 46 yo-5 partners  Other Topics Concern    Not on file  Social History Narrative   Not on file   Social Determinants of Health   Financial Resource Strain: Not on file  Food Insecurity: Not on file  Transportation Needs: Not on file  Physical Activity: Not on file  Stress: Not on file  Social Connections: Not on file  Intimate Partner Violence: Not on file    Family History  Problem Relation Age of Onset   Breast cancer Mother    Parkinsonism Mother 72       DIED W PARKINSON'S   Hypertension Father    Heart disease Father    Colon polyps Father    Dementia Father    Diabetes Brother    Colon cancer Neg Hx    Esophageal cancer Neg Hx    Rectal cancer Neg Hx    Stomach cancer Neg Hx    Pancreatic cancer Neg Hx     Current Outpatient Medications  Medication Sig Dispense Refill   atorvastatin (LIPITOR) 10 MG tablet Take 1 tablet (10 mg total) by mouth daily. 90 tablet 3   DULoxetine (CYMBALTA) 30 MG capsule Take 1 capsule (30 mg total) by mouth daily. 90 capsule 3  meloxicam (MOBIC) 7.5 MG tablet Take 1 tablet (7.5 mg total) by mouth daily. 90 tablet 3   No current facility-administered medications for this visit.    No Known Allergies  REVIEW OF SYSTEMS (negative unless checked):   Cardiac:  []  Chest pain or chest pressure? []  Shortness of breath upon activity? []  Shortness of breath when lying flat? []  Irregular heart rhythm?  Vascular:  []  Pain in calf, thigh, or hip brought on by walking? []  Pain in feet at night that wakes you up from your sleep? []  Blood clot in your veins? []  Leg swelling?  Pulmonary:  []  Oxygen at home? []  Productive cough? []  Wheezing?  Neurologic:  []  Sudden weakness in arms or legs? []  Sudden numbness in arms or legs? []  Sudden onset of difficult speaking or slurred speech? []  Temporary loss of vision in one eye? []  Problems with dizziness?  Gastrointestinal:  []  Blood in stool? []  Vomited blood?  Genitourinary:  []  Burning when urinating? []  Blood in  urine?  Psychiatric:  []  Major depression  Hematologic:  []  Bleeding problems? []  Problems with blood clotting?  Dermatologic:  [x]  Rashes or ulcers? See HPI  Constitutional:  []  Fever or chills?  Ear/Nose/Throat:  []  Change in hearing? []  Nose bleeds? []  Sore throat?  Musculoskeletal:  []  Back pain? [x]  Joint pain? []  Muscle pain?   Physical Examination     Vitals:   10/05/20 1140  BP: 122/79  Pulse: 71  Resp: 20  Temp: 97.9 F (36.6 C)  TempSrc: Temporal  SpO2: 97%  Weight: 156 lb 1.6 oz (70.8 kg)  Height: 5\' 6"  (1.676 m)   Body mass index is 25.2 kg/m.  General:  WDWN in NAD; vital signs documented above Gait: Not observed HENT: WNL, normocephalic Pulmonary: normal non-labored breathing , without Rales, rhonchi,  wheezing Cardiac: regular HR, without  Murmurs without carotid bruits Abdomen: soft, NT, no masses Skin: with  approx 2 x 4 cm brownish skin patched of R and L anterior lower legs Vascular Exam/Pulses: 2+ dorsalis pedis, posterior tibial pulses bilaterally Extremities: with varicose veins, with reticular veins, without edema, with ? stasis pigmentation, without lipodermatosclerosis, without ulcers Musculoskeletal: no muscle wasting or atrophy  Neurologic: A&O X 3;  No focal weakness or paresthesias are detected Psychiatric:  The pt has Normal affect.  Non-invasive Vascular Imaging   BLE Venous Insufficiency Duplex  Summary:  Bilateral:  - No evidence of deep vein thrombosis seen in the lower extremities,  bilaterally, from the common femoral through the popliteal veins.  - No evidence of superficial venous thrombosis in the lower extremities,  bilaterally.  - No evidence of superficial venous reflux seen in the greater saphenous  veins bilaterally.  - No evidence of superficial venous reflux seen in the short saphenous  veins bilaterally.     Left:  - Venous reflux is noted in the left common femoral vein.   Medical Decision Making    Monica Hood is a 65 y.o. female who presents with: BLE chronic venous insufficiency, minor varicose veins and reticular veins.  No evidence to suggest arterial insufficiency.  Lower extremity venous duplex examination today reveals no DVT, SVT or venous reflux. Based on the patient's history and examination, I recommend: healthy vein measures: written material given. I recommended 15-20 mm Hg support stocking particularly when sitting or standing for prolonged periods of time. Patient my want to consider dermatologic evaluation of these lower legs areas. Thank you for allowing to participate in  this patient's care.   Milinda Antis, PA-C [facility] Office: [phone]  10/05/2020, 11:52 AM  Clinic MD: Dr. Edilia Bo

## 2020-10-26 ENCOUNTER — Ambulatory Visit: Payer: BLUE CROSS/BLUE SHIELD | Admitting: Internal Medicine

## 2020-12-28 ENCOUNTER — Ambulatory Visit: Payer: Self-pay | Admitting: Dermatology

## 2021-02-06 ENCOUNTER — Other Ambulatory Visit: Payer: Self-pay | Admitting: Family Medicine

## 2021-02-06 DIAGNOSIS — Z1231 Encounter for screening mammogram for malignant neoplasm of breast: Secondary | ICD-10-CM

## 2021-02-09 ENCOUNTER — Ambulatory Visit
Admission: RE | Admit: 2021-02-09 | Discharge: 2021-02-09 | Disposition: A | Discharge status: Home / Self Care | Payer: 59 | Source: Ambulatory Visit | Attending: Family Medicine | Admitting: Family Medicine

## 2021-02-09 ENCOUNTER — Other Ambulatory Visit: Payer: Self-pay

## 2021-02-09 DIAGNOSIS — Z1231 Encounter for screening mammogram for malignant neoplasm of breast: Secondary | ICD-10-CM

## 2021-02-11 ENCOUNTER — Ambulatory Visit: Payer: 59

## 2021-04-21 ENCOUNTER — Telehealth: Payer: Self-pay | Admitting: Family Medicine

## 2021-04-21 MED ORDER — FLUTICASONE PROPIONATE 50 MCG/ACT NA SUSP: 2.0000 | Freq: Every day | NASAL | 1 refills | Status: DC

## 2021-04-21 NOTE — Telephone Encounter (Signed)
Patient called to get new prescription on fluticasone (FLONASE) 50 MCG/ACT nasal spray Patient states she would like three refills is possible as she orders all her prescriptions in threes   Please send to [id number] - [place], [place] - [address] AT [facility] [address] Phone:  [phone]  Fax:  [phone]         Please advise

## 2021-04-21 NOTE — Telephone Encounter (Signed)
Rx sent in for 3 bottles with refill.

## 2021-06-07 ENCOUNTER — Telehealth: Payer: Self-pay | Admitting: Family Medicine

## 2021-06-07 DIAGNOSIS — M170 Bilateral primary osteoarthritis of knee: Secondary | ICD-10-CM

## 2021-06-07 MED ORDER — MELOXICAM 7.5 MG PO TABS: 7.5000 mg | ORAL_TABLET | Freq: Every day | ORAL | 3 refills | Status: DC

## 2021-06-07 NOTE — Telephone Encounter (Signed)
Rx sent in

## 2021-06-07 NOTE — Telephone Encounter (Signed)
Pt has cpe sch for may and would like #90 meloxicam (MOBIC) 7.5 MG tablet  ?[facility] [id number] - [place], [place] - [place] Phone:  [phone]  ?Fax:  [phone]  ?  ? ?

## 2021-08-29 ENCOUNTER — Encounter: Payer: Self-pay | Admitting: Family Medicine

## 2021-08-30 NOTE — Progress Notes (Unsigned)
HPI: Monica Hood is a 66 y.o. female, who is here today for her routine physical.  Last CPE: 06/02/19. Her last office visit in 2022.  Regular exercise: TV exercises 30 min 4 times per week, started 2 months. Following a healthful diet: It is "pretty healthy." She cooks most of her meals.  Chronic medical problems: Hyperlipidemia, depression, vein disease, and allergies among some.  Immunization History  Administered Date(s) Administered   Influenza Whole 04/11/2009   Influenza, Quadrivalent, Recombinant, Inj, Pf 03/13/2018   Influenza,inj,Quad PF,6+ Mos 12/18/2013   Influenza,inj,Quad PF,6-35 Mos 01/29/2019   PPD Test 12/13/2011, 12/23/2014   Td 04/02/2005   Tdap 04/05/2011   Zoster Recombinat (Shingrix) 05/22/2018, 11/06/2018   Health Maintenance  Topic Date Due   TETANUS/TDAP  04/04/2021   COVID-19 Vaccine (1) 09/15/2021 (Originally 01/21/1956)   HIV Screening  09/01/2032 (Originally 07/22/1970)   INFLUENZA VACCINE  10/31/2021   MAMMOGRAM  02/10/2023   PAP SMEAR-Modifier  03/04/2023   COLONOSCOPY (Pts 45-5860yrs Insurance coverage will need to be confirmed)  06/30/2025   Hepatitis C Screening  Completed   Zoster Vaccines- Shingrix  Completed   HPV VACCINES  Aged Out   PAP SMEAR 03/16/2018. Follows with gyn annually. Former smoker, quit > 15 years ago.  HLD: She is on Atorvastatin 10 mg daily and low fat diet. Lab Results  Component Value Date   CHOL 206 (H) 08/25/2020   HDL 54.90 08/25/2020   LDLCALC 134 (H) 08/25/2020   LDLDIRECT 153.1 07/06/2011   TRIG 84.0 08/25/2020   CHOLHDL 4 08/25/2020   Depression on Cymbalta 30 mg daily, which she feels like it is helping. Dx'ed in her early 6430's.     09/01/2021    8:10 AM 08/24/2020   10:47 PM  Depression screen PHQ 2/9  Decreased Interest 1 0  Down, Depressed, Hopeless 1 0  PHQ - 2 Score 2 0  Altered sleeping 1 0  Tired, decreased energy 1 0  Change in appetite 0 0  Feeling bad or failure about yourself  1  0  Trouble concentrating 0 0  Moving slowly or fidgety/restless 0 0  Suicidal thoughts 0 0  PHQ-9 Score 5 0  Difficult doing work/chores Not difficult at all Not difficult at all   Knee OA, Meloxicam 7.5 mg helps greatly. Achy like pain for 5-6 years. She has been evaluated by ortho and TKR was considered. Tolerating medications well, no side effects.  Review of Systems  Constitutional:  Negative for activity change, appetite change and fever.  HENT:  Negative for hearing loss, mouth sores and sore throat.   Eyes:  Negative for redness and visual disturbance.  Respiratory:  Negative for cough, shortness of breath and wheezing.   Cardiovascular:  Negative for chest pain and leg swelling.  Gastrointestinal:  Negative for abdominal pain, nausea and vomiting.       No changes in bowel habits.  Endocrine: Negative for cold intolerance, heat intolerance, polydipsia, polyphagia and polyuria.  Genitourinary:  Negative for decreased urine volume, dysuria, hematuria, vaginal bleeding and vaginal discharge.  Musculoskeletal:  Positive for arthralgias. Negative for gait problem and myalgias.  Skin:  Negative for color change and rash.  Allergic/Immunologic: Positive for environmental allergies.  Neurological:  Negative for syncope, weakness and headaches.  Hematological:  Negative for adenopathy. Does not bruise/bleed easily.  Psychiatric/Behavioral:  Negative for confusion.   All other systems reviewed and are negative.  Current Outpatient Medications on File Prior to Visit  Medication Sig  Dispense Refill   atorvastatin (LIPITOR) 10 MG tablet Take 1 tablet (10 mg total) by mouth daily. 90 tablet 3   DULoxetine (CYMBALTA) 30 MG capsule Take 1 capsule (30 mg total) by mouth daily. 90 capsule 3   fluticasone (FLONASE) 50 MCG/ACT nasal spray Place 2 sprays into both nostrils daily. 48 g 1   meloxicam (MOBIC) 7.5 MG tablet Take 1 tablet (7.5 mg total) by mouth daily. 90 tablet 3   No current  facility-administered medications on file prior to visit.   Past Medical History:  Diagnosis Date   Arthritis    Depression    Hyperlipidemia    past hx of but not now    Smoker    1/2 pack per week-former smojker 06-15-15   Past Surgical History:  Procedure Laterality Date   COLONOSCOPY     last colon 17 years ago normal per pt   DENTAL SURGERY     No Known Allergies  Family History  Problem Relation Age of Onset   Parkinsonism Mother 32       DIED W PARKINSON'S   Hypertension Father    Heart disease Father    Colon polyps Father    Dementia Father    Diabetes Brother    Colon cancer Neg Hx    Esophageal cancer Neg Hx    Rectal cancer Neg Hx    Stomach cancer Neg Hx    Pancreatic cancer Neg Hx    Social History   Socioeconomic History   Marital status: Single    Spouse name: Not on file   Number of children: Not on file   Years of education: Not on file   Highest education level: Not on file  Occupational History   Not on file  Tobacco Use   Smoking status: Former   Smokeless tobacco: Never  Vaping Use   Vaping Use: Never used  Substance and Sexual Activity   Alcohol use: Yes    Alcohol/week: 6.0 standard drinks    Types: 6 Glasses of wine per week    Comment: OCC   Drug use: No   Sexual activity: Not Currently    Comment: 1st intercourse 35 yo-5 partners  Other Topics Concern   Not on file  Social History Narrative   Not on file   Social Determinants of Health   Financial Resource Strain: Not on file  Food Insecurity: Not on file  Transportation Needs: Not on file  Physical Activity: Not on file  Stress: Not on file  Social Connections: Not on file   Vitals:   09/01/21 0741  BP: 122/80  Pulse: 82  Resp: 16  SpO2: 99%   Body mass index is 23.95 kg/m.  Wt Readings from Last 3 Encounters:  09/01/21 148 lb 6 oz (67.3 kg)  10/05/20 156 lb 1.6 oz (70.8 kg)  08/24/20 159 lb 2 oz (72.2 kg)   Physical Exam Vitals and nursing note  reviewed.  Constitutional:      General: She is not in acute distress.    Appearance: She is well-developed.  HENT:     Head: Normocephalic and atraumatic.     Right Ear: Hearing, tympanic membrane, ear canal and external ear normal.     Left Ear: Hearing, tympanic membrane, ear canal and external ear normal.     Mouth/Throat:     Mouth: Mucous membranes are moist.     Pharynx: Oropharynx is clear. Uvula midline.  Eyes:     Extraocular Movements: Extraocular  movements intact.     Conjunctiva/sclera: Conjunctivae normal.     Pupils: Pupils are equal, round, and reactive to light.  Neck:     Thyroid: No thyromegaly.     Trachea: No tracheal deviation.  Cardiovascular:     Rate and Rhythm: Normal rate and regular rhythm.     Pulses:          Dorsalis pedis pulses are 2+ on the right side and 2+ on the left side.     Heart sounds: No murmur heard. Pulmonary:     Effort: Pulmonary effort is normal. No respiratory distress.     Breath sounds: Normal breath sounds.  Abdominal:     Palpations: Abdomen is soft. There is no hepatomegaly or mass.     Tenderness: There is no abdominal tenderness.  Genitourinary:    Comments: Deferred to gyn. Musculoskeletal:     Comments: No signs of synovitis appreciated.  Lymphadenopathy:     Cervical: No cervical adenopathy.     Upper Body:     Right upper body: No supraclavicular adenopathy.     Left upper body: No supraclavicular adenopathy.  Skin:    General: Skin is warm.     Findings: No erythema or rash.  Neurological:     General: No focal deficit present.     Mental Status: She is alert and oriented to person, place, and time.     Cranial Nerves: No cranial nerve deficit.     Coordination: Coordination normal.     Gait: Gait normal.     Deep Tendon Reflexes:     Reflex Scores:      Bicep reflexes are 2+ on the right side and 2+ on the left side.      Patellar reflexes are 2+ on the right side and 2+ on the left side. Psychiatric:      Comments: Well groomed, good eye contact.   ASSESSMENT AND PLAN:  Ms. LARAYAH CLUTE was here today annual physical examination and follow up.  Orders Placed This Encounter  Procedures   Comprehensive metabolic panel   Lipid panel   Hemoglobin A1c   Lab Results  Component Value Date   HGBA1C 5.4 09/01/2021   Lab Results  Component Value Date   CREATININE 0.65 09/01/2021   BUN 24 (H) 09/01/2021   NA 140 09/01/2021   K 4.1 09/01/2021   CL 103 09/01/2021   CO2 30 09/01/2021   Lab Results  Component Value Date   ALT 13 09/01/2021   AST 18 09/01/2021   ALKPHOS 88 09/01/2021   BILITOT 0.8 09/01/2021   Lab Results  Component Value Date   CHOL 187 09/01/2021   HDL 57.30 09/01/2021   LDLCALC 112 (H) 09/01/2021   LDLDIRECT 153.1 07/06/2011   TRIG 85.0 09/01/2021   CHOLHDL 3 09/01/2021   Routine general medical examination at a health care facility We discussed the importance of regular physical activity and healthy diet for prevention of chronic illness and/or complications. Preventive guidelines reviewed. Vaccination updated. Ca++ and vit D supplementation recommended. Next CPE in a year.  The 10-year ASCVD risk score (Arnett DK, et al., 2019) is: 4.3%   Values used to calculate the score:     Age: 29 years     Sex: Female     Is Non-Hispanic African American: No     Diabetic: No     Tobacco smoker: No     Systolic Blood Pressure: 122 mmHg     Is  BP treated: No     HDL Cholesterol: 57.3 mg/dL     Total Cholesterol: 187 mg/dL  Screening for endocrine, metabolic and immunity disorder -     Comprehensive metabolic panel; Future -     Hemoglobin A1c; Future  Depression, major, in remission (HCC) Stable overall. Continue Duloxetine 30 mg daily.  Osteoarthritis of both knees Meloxicam 7.5 mg daily prn is helping with pain. We discussed some side effects of chronic NSAID's intake. Low impact exercise recommended.  Hyperlipidemia Continue Atorvastatin 10 mg  daily and low fat diet. Further recommendations according to FLP results.  Return in 1 year (on 09/02/2022) for CPE and f/u.  Junious Ragone G. [place], MD  [facility]. [place] office.

## 2021-09-01 ENCOUNTER — Encounter: Payer: Self-pay | Admitting: Family Medicine

## 2021-09-01 ENCOUNTER — Ambulatory Visit (INDEPENDENT_AMBULATORY_CARE_PROVIDER_SITE_OTHER): Payer: Commercial Managed Care - HMO | Admitting: Family Medicine

## 2021-09-01 VITALS — BP 122/80 | HR 82 | Resp 16 | Ht 66.0 in | Wt 148.4 lb

## 2021-09-01 DIAGNOSIS — F325 Major depressive disorder, single episode, in full remission: Secondary | ICD-10-CM

## 2021-09-01 DIAGNOSIS — Z130 Encounter for screening for diseases of the blood and blood-forming organs and certain disorders involving the immune mechanism: Secondary | ICD-10-CM | POA: Diagnosis not present

## 2021-09-01 DIAGNOSIS — M170 Bilateral primary osteoarthritis of knee: Secondary | ICD-10-CM | POA: Diagnosis not present

## 2021-09-01 DIAGNOSIS — E785 Hyperlipidemia, unspecified: Secondary | ICD-10-CM | POA: Diagnosis not present

## 2021-09-01 DIAGNOSIS — Z1329 Encounter for screening for other suspected endocrine disorder: Secondary | ICD-10-CM

## 2021-09-01 DIAGNOSIS — Z23 Encounter for immunization: Secondary | ICD-10-CM | POA: Diagnosis not present

## 2021-09-01 DIAGNOSIS — Z0000 Encounter for general adult medical examination without abnormal findings: Secondary | ICD-10-CM

## 2021-09-01 DIAGNOSIS — Z13228 Encounter for screening for other metabolic disorders: Secondary | ICD-10-CM | POA: Diagnosis not present

## 2021-09-01 LAB — COMPREHENSIVE METABOLIC PANEL
ALT: 13 U/L (ref 0–35)
AST: 18 U/L (ref 0–37)
Albumin: 4.7 g/dL (ref 3.5–5.2)
Alkaline Phosphatase: 88 U/L (ref 39–117)
BUN: 24 mg/dL — ABNORMAL HIGH (ref 6–23)
CO2: 30 mEq/L (ref 19–32)
Calcium: 9.6 mg/dL (ref 8.4–10.5)
Chloride: 103 mEq/L (ref 96–112)
Creatinine, Ser: 0.65 mg/dL (ref 0.40–1.20)
GFR: 93.25 mL/min (ref >60.00)
Glucose, Bld: 78 mg/dL (ref 70–99)
Potassium: 4.1 mEq/L (ref 3.5–5.1)
Sodium: 140 mEq/L (ref 135–145)
Total Bilirubin: 0.8 mg/dL (ref 0.2–1.2)
Total Protein: 7.9 g/dL (ref 6.0–8.3)

## 2021-09-01 LAB — LIPID PANEL
Cholesterol: 187 mg/dL (ref 0–200)
HDL: 57.3 mg/dL (ref >39.00)
LDL Cholesterol: 112 mg/dL — ABNORMAL HIGH (ref 0–99)
NonHDL: 129.22
Total CHOL/HDL Ratio: 3
Triglycerides: 85 mg/dL (ref 0.0–149.0)
VLDL: 17 mg/dL (ref 0.0–40.0)

## 2021-09-01 LAB — HEMOGLOBIN A1C: Hgb A1c MFr Bld: 5.4 % (ref 4.6–6.5)

## 2021-09-01 NOTE — Assessment & Plan Note (Signed)
Continue Atorvastatin 10 mg daily and low fat diet. Further recommendations according to FLP results. 

## 2021-09-01 NOTE — Patient Instructions (Addendum)
A few things to remember from today's visit:  Routine general medical examination at a health care facility  Hyperlipidemia, unspecified hyperlipidemia type - Plan: Lipid panel  Depression, major, in remission (HCC)  Osteoarthritis of both knees, unspecified osteoarthritis type - Plan: Comprehensive metabolic panel  Screening for endocrine, metabolic and immunity disorder - Plan: Comprehensive metabolic panel, Hemoglobin A1c  If you need refills please call your pharmacy. Do not use My Chart to request refills or for acute issues that need immediate attention.   Please be sure medication list is accurate. If a new problem present, please set up appointment sooner than planned today. No changes today.  Health Maintenance, Female Adopting a healthy lifestyle and getting preventive care are important in promoting health and wellness. Ask your health care provider about: The right schedule for you to have regular tests and exams. Things you can do on your own to prevent diseases and keep yourself healthy. What should I know about diet, weight, and exercise? Eat a healthy diet  Eat a diet that includes plenty of vegetables, fruits, low-fat dairy products, and lean protein. Do not eat a lot of foods that are high in solid fats, added sugars, or sodium. Maintain a healthy weight Body mass index (BMI) is used to identify weight problems. It estimates body fat based on height and weight. Your health care provider can help determine your BMI and help you achieve or maintain a healthy weight. Get regular exercise Get regular exercise. This is one of the most important things you can do for your health. Most adults should: Exercise for at least 150 minutes each week. The exercise should increase your heart rate and make you sweat (moderate-intensity exercise). Do strengthening exercises at least twice a week. This is in addition to the moderate-intensity exercise. Spend less time sitting. Even  light physical activity can be beneficial. Watch cholesterol and blood lipids Have your blood tested for lipids and cholesterol at 63 years of age, then have this test every 5 years. Have your cholesterol levels checked more often if: Your lipid or cholesterol levels are high. You are older than 64 years of age. You are at high risk for heart disease. What should I know about cancer screening? Depending on your health history and family history, you may need to have cancer screening at various ages. This may include screening for: Breast cancer. Cervical cancer. Colorectal cancer. Skin cancer. Lung cancer. What should I know about heart disease, diabetes, and high blood pressure? Blood pressure and heart disease High blood pressure causes heart disease and increases the risk of stroke. This is more likely to develop in people who have high blood pressure readings or are overweight. Have your blood pressure checked: Every 3-5 years if you are 19-41 years of age. Every year if you are 52 years old or older. Diabetes Have regular diabetes screenings. This checks your fasting blood sugar level. Have the screening done: Once every three years after age 68 if you are at a normal weight and have a low risk for diabetes. More often and at a younger age if you are overweight or have a high risk for diabetes. What should I know about preventing infection? Hepatitis B If you have a higher risk for hepatitis B, you should be screened for this virus. Talk with your health care provider to find out if you are at risk for hepatitis B infection. Hepatitis C Testing is recommended for: Everyone born from [birth year] through 1963. Anyone with known  risk factors for hepatitis C. Sexually transmitted infections (STIs) Get screened for STIs, including gonorrhea and chlamydia, if: You are sexually active and are younger than 64 years of age. You are older than 64 years of age and your health care provider tells  you that you are at risk for this type of infection. Your sexual activity has changed since you were last screened, and you are at increased risk for chlamydia or gonorrhea. Ask your health care provider if you are at risk. Ask your health care provider about whether you are at high risk for HIV. Your health care provider may recommend a prescription medicine to help prevent HIV infection. If you choose to take medicine to prevent HIV, you should first get tested for HIV. You should then be tested every 3 months for as long as you are taking the medicine. Pregnancy If you are about to stop having your period (premenopausal) and you may become pregnant, seek counseling before you get pregnant. Take 400 to 800 micrograms (mcg) of folic acid every day if you become pregnant. Ask for birth control (contraception) if you want to prevent pregnancy. Osteoporosis and menopause Osteoporosis is a disease in which the bones lose minerals and strength with aging. This can result in bone fractures. If you are 75 years old or older, or if you are at risk for osteoporosis and fractures, ask your health care provider if you should: Be screened for bone loss. Take a calcium or vitamin D supplement to lower your risk of fractures. Be given hormone replacement therapy (HRT) to treat symptoms of menopause. Follow these instructions at home: Alcohol use Do not drink alcohol if: Your health care provider tells you not to drink. You are pregnant, may be pregnant, or are planning to become pregnant. If you drink alcohol: Limit how much you have to: 0-1 drink a day. Know how much alcohol is in your drink. In the [place] one drink equals one 12 oz bottle of beer (355 mL), one 5 oz glass of wine (148 mL), or one 1 oz glass of hard liquor (44 mL). Lifestyle Do not use any products that contain nicotine or tobacco. These products include cigarettes, chewing tobacco, and vaping devices, such as e-cigarettes. If you need help  quitting, ask your health care provider. Do not use street drugs. Do not share needles. Ask your health care provider for help if you need support or information about quitting drugs. General instructions Schedule regular health, dental, and eye exams. Stay current with your vaccines. Tell your health care provider if: You often feel depressed. You have ever been abused or do not feel safe at home. Summary Adopting a healthy lifestyle and getting preventive care are important in promoting health and wellness. Follow your health care provider's instructions about healthy diet, exercising, and getting tested or screened for diseases. Follow your health care provider's instructions on monitoring your cholesterol and blood pressure. This information is not intended to replace advice given to you by your health care provider. Make sure you discuss any questions you have with your health care provider. Document Revised: 08/08/2020 Document Reviewed: 08/08/2020 Elsevier Patient Education  2023 [employer].

## 2021-09-01 NOTE — Assessment & Plan Note (Signed)
Meloxicam 7.5 mg daily prn is helping with pain. We discussed some side effects of chronic NSAID's intake. Low impact exercise recommended.

## 2021-09-01 NOTE — Assessment & Plan Note (Signed)
Stable overall. Continue Duloxetine 30 mg daily.

## 2021-09-02 MED ORDER — ATORVASTATIN CALCIUM 10 MG PO TABS: 10.0000 mg | ORAL_TABLET | Freq: Every day | ORAL | 3 refills | Status: DC

## 2021-10-04 ENCOUNTER — Other Ambulatory Visit: Payer: Self-pay | Admitting: Family Medicine

## 2021-10-04 DIAGNOSIS — F325 Major depressive disorder, single episode, in full remission: Secondary | ICD-10-CM

## 2022-03-23 ENCOUNTER — Other Ambulatory Visit: Payer: Self-pay | Admitting: Family Medicine

## 2022-03-23 DIAGNOSIS — Z1231 Encounter for screening mammogram for malignant neoplasm of breast: Secondary | ICD-10-CM

## 2022-03-28 ENCOUNTER — Telehealth: Payer: Self-pay

## 2022-03-28 NOTE — Telephone Encounter (Signed)
--  Caller states she has been having tingling in both feet, mostly on the right foot. States sx have been coming and going through out the morning and no current sx right now.  03/28/2022 10:37:39 AM SEE PCP WITHIN 3 DAYS Weiss-Hilton, RN, Lindsay  Referrals REFERRED TO PCP OFFICE  03/28/22 1311 - Pt noted to have canceled appt for above reasons on 04/06/22. LVM to schedule appt for pt if needed. If pt calls back please schedule appt as needed.

## 2022-03-30 ENCOUNTER — Ambulatory Visit
Admission: RE | Admit: 2022-03-30 | Discharge: 2022-03-30 | Disposition: A | Discharge status: Home / Self Care | Payer: Commercial Managed Care - HMO | Source: Ambulatory Visit | Attending: Family Medicine | Admitting: Family Medicine

## 2022-03-30 ENCOUNTER — Ambulatory Visit (INDEPENDENT_AMBULATORY_CARE_PROVIDER_SITE_OTHER): Payer: Commercial Managed Care - HMO | Admitting: Internal Medicine

## 2022-03-30 ENCOUNTER — Ambulatory Visit: Payer: Commercial Managed Care - HMO | Admitting: Family Medicine

## 2022-03-30 ENCOUNTER — Encounter: Payer: Self-pay | Admitting: Internal Medicine

## 2022-03-30 VITALS — BP 130/78 | HR 71 | Temp 98.1°F | Ht 66.0 in | Wt 151.0 lb

## 2022-03-30 DIAGNOSIS — R202 Paresthesia of skin: Secondary | ICD-10-CM

## 2022-03-30 DIAGNOSIS — Z1231 Encounter for screening mammogram for malignant neoplasm of breast: Secondary | ICD-10-CM

## 2022-03-30 NOTE — Progress Notes (Signed)
Patient ID: HILLARIE HARRIGAN, female   DOB: 12/19/1955, 66 y.o.   MRN: [id number]        Chief Complaint: follow up right foot numbness x 1 wk       HPI:  ORION VANDERVORT is a 66 y.o. female here with c/o mild intermittent right foot numbness that comes and goes but more numb than not x 1 wk; Has had recurring lower back pain at times as wel but not sure if related in timing.  Denies pain or weakness, change in gait or falls.  No injury.  Pt denies chest pain, increased sob or doe, wheezing, orthopnea, PND, increased LE swelling, palpitations, dizziness or syncope.   Pt denies polydipsia, polyuria, or new focal neuro s/s.          Wt Readings from Last 3 Encounters:  03/30/22 151 lb (68.5 kg)  09/01/21 148 lb 6 oz (67.3 kg)  10/05/20 156 lb 1.6 oz (70.8 kg)   BP Readings from Last 3 Encounters:  03/30/22 130/78  09/01/21 122/80  10/05/20 122/79         Past Medical History:  Diagnosis Date   Arthritis    Depression    Hyperlipidemia    past hx of but not now    Smoker    1/2 pack per week-former smojker 06-15-15   Past Surgical History:  Procedure Laterality Date   COLONOSCOPY     last colon 17 years ago normal per pt   DENTAL SURGERY      reports that she has quit smoking. She has never used smokeless tobacco. She reports current alcohol use of about 6.0 standard drinks of alcohol per week. She reports that she does not use drugs. family history includes Colon polyps in her father; Dementia in her father; Diabetes in her brother; Heart disease in her father; Hypertension in her father; Parkinsonism (age of onset: 8) in her mother. No Known Allergies Current Outpatient Medications on File Prior to Visit  Medication Sig Dispense Refill   atorvastatin (LIPITOR) 10 MG tablet Take 1 tablet (10 mg total) by mouth daily. 90 tablet 3   augmented betamethasone dipropionate (DIPROLENE-AF) 0.05 % cream Apply topically 2 (two) times daily as needed.     DULoxetine (CYMBALTA) 30 MG capsule TAKE  1 CAPSULE(30 MG) BY MOUTH DAILY 90 capsule 3   fluticasone (FLONASE) 50 MCG/ACT nasal spray Place 2 sprays into both nostrils daily. 48 g 1   meloxicam (MOBIC) 7.5 MG tablet Take 1 tablet (7.5 mg total) by mouth daily. 90 tablet 3   TERBINAFINE EX 1,000 mg.     chlorhexidine (PERIDEX) 0.12 % solution      DULoxetine (CYMBALTA) 30 MG capsule Take 1 capsule by mouth daily.     No current facility-administered medications on file prior to visit.        ROS:  All others reviewed and negative.  Objective        PE:  BP 130/78 (BP Location: Right Arm, Patient Position: Sitting, Cuff Size: Large)   Pulse 71   Temp 98.1 F (36.7 C) (Oral)   Ht 5\' 6"  (1.676 m)   Wt 151 lb (68.5 kg)   SpO2 95%   BMI 24.37 kg/m                 Constitutional: Pt appears in NAD               HENT: Head: NCAT.  Right Ear: External ear normal.                 Left Ear: External ear normal.                Eyes: . Pupils are equal, round, and reactive to light. Conjunctivae and EOM are normal               Nose: without d/c or deformity               Neck: Neck supple. Gross normal ROM               Cardiovascular: Normal rate and regular rhythm.                 Pulmonary/Chest: Effort normal and breath sounds without rales or wheezing.                               Neurological: Pt is alert. At baseline orientation, motor grossly intact, distal RLE with mild decrsased sens to LT               Skin: Skin is warm. No rashes, no other new lesions, LE edema - none               Psychiatric: Pt behavior is normal without agitation   Micro: none  Cardiac tracings I have personally interpreted today:  none  Pertinent Radiological findings (summarize): none   Lab Results  Component Value Date   WBC 6.2 03/03/2018   HGB 13.8 03/03/2018   HCT 41.0 03/03/2018   PLT 273 03/03/2018   GLUCOSE 78 09/01/2021   CHOL 187 09/01/2021   TRIG 85.0 09/01/2021   HDL 57.30 09/01/2021   LDLDIRECT 153.1  07/06/2011   LDLCALC 112 (H) 09/01/2021   ALT 13 09/01/2021   AST 18 09/01/2021   NA 140 09/01/2021   K 4.1 09/01/2021   CL 103 09/01/2021   CREATININE 0.65 09/01/2021   BUN 24 (H) 09/01/2021   CO2 30 09/01/2021   TSH 1.392 12/18/2013   HGBA1C 5.4 09/01/2021   Assessment/Plan:  JORDIE SKALSKY is a 66 y.o. White or Caucasian [1] female with  has a past medical history of Arthritis, Depression, Hyperlipidemia, and Smoker.  Paresthesia of right leg Recent onset, suspect peripheral etiology most likely related to lumbar disorder; for neurology referral and consider MRI and/or NCS/EMG, but pt wants referral placed in the record on Jan 2 due to insurance changing  Followup: Return if symptoms worsen or fail to improve.  Oliver Barre, MD 03/30/2022 9:16 PM [facility]

## 2022-03-30 NOTE — Assessment & Plan Note (Signed)
Recent onset, suspect peripheral etiology most likely related to lumbar disorder; for neurology referral and consider MRI and/or NCS/EMG, but pt wants referral placed in the record on Jan 2 due to insurance changing

## 2022-03-30 NOTE — Patient Instructions (Signed)
Please continue all other medications as before, and refills have been done if requested.  Please have the pharmacy call with any other refills you may need.  Please keep your appointments with your specialists as you may have planned  You will be contacted regarding the referral for: neurology   

## 2022-04-03 ENCOUNTER — Encounter: Payer: Self-pay | Admitting: Neurology

## 2022-04-03 ENCOUNTER — Other Ambulatory Visit: Payer: Self-pay | Admitting: Internal Medicine

## 2022-04-03 DIAGNOSIS — R202 Paresthesia of skin: Secondary | ICD-10-CM

## 2022-04-06 ENCOUNTER — Ambulatory Visit: Payer: Commercial Managed Care - HMO | Admitting: Family Medicine

## 2022-05-04 NOTE — Progress Notes (Deleted)
Initial neurology clinic note  SERVICE DATE: 05/11/22  Reason for Evaluation: Consultation requested by Biagio Borg, MD for an opinion regarding right foot numbness. My final recommendations will be communicated back to the requesting physician by way of shared medical record or letter to requesting physician via [place] mail.  HPI: This is [name], a 67 y.o. ***-handed female with a medical history of HLD, OA, depression, former smoker*** who presents to neurology clinic with the chief complaint of right foot numbness***. The patient is accompanied by ***.  *** Right foot numbness since ~03/23/22 Intermittent back pain, not sure it is related to foot though No pain, weakness, change in gait or falls Increased LE swelling PCP mentioned possible EMG  The patient has not*** had similar episodes of symptoms in the past. ***  Muscle bulk loss? *** Muscle pain? ***  Cramps/Twitching? *** Suggestion of myotonia/difficulty relaxing after contraction? ***  Fatigable weakness?*** Does strength improve after brief exercise?***  Able to brush hair/teeth without difficulty? *** Able to button shirts/use zips? *** Clumsiness/dropping grasped objects?*** Can you arise from squatted position easily? *** Able to get out of chair without using arms? *** Able to walk up steps easily? *** Use an assistive device to walk? *** Significant imbalance with walking? *** Falls?*** Any change in urine color, especially after exertion/physical activity? ***  The patient denies*** symptoms suggestive of oculobulbar weakness including diplopia, ptosis, dysphagia, poor saliva control, dysarthria/dysphonia, impaired mastication, facial weakness/droop.  There are no*** neuromuscular respiratory weakness symptoms, particularly orthopnea>dyspnea.   Pseudobulbar affect is absent***.  The patient does not*** report symptoms referable to autonomic dysfunction including impaired sweating, heat or cold  intolerance, excessive mucosal dryness, gastroparetic early satiety, postprandial abdominal bloating, constipation, bowel or bladder dyscontrol, erectile dysfunction*** or syncope/presyncope/orthostatic intolerance.  There are no*** complaints relating to other symptoms of small fiber modalities including paresthesia/pain.  The patient has not *** noticed any recent skin rashes nor does he*** report any constitutional symptoms like fever, night sweats, anorexia or unintentional weight loss.  EtOH use: *** 6 drinks per week Restrictive diet? *** Family history of neuropathy/myopathy/NM disease?***  Previous labs, electrodiagnostics, and neuroimaging are summarized below, but pertinent findings include***  Any biopsy done? *** Current medications being tried for the patient's symptoms include ***  Prior medications that have been tried: ***   MEDICATIONS:  Outpatient Encounter Medications as of 05/11/2022  Medication Sig   atorvastatin (LIPITOR) 10 MG tablet Take 1 tablet (10 mg total) by mouth daily.   augmented betamethasone dipropionate (DIPROLENE-AF) 0.05 % cream Apply topically 2 (two) times daily as needed.   chlorhexidine (PERIDEX) 0.12 % solution    DULoxetine (CYMBALTA) 30 MG capsule TAKE 1 CAPSULE(30 MG) BY MOUTH DAILY   DULoxetine (CYMBALTA) 30 MG capsule Take 1 capsule by mouth daily.   fluticasone (FLONASE) 50 MCG/ACT nasal spray Place 2 sprays into both nostrils daily.   meloxicam (MOBIC) 7.5 MG tablet Take 1 tablet (7.5 mg total) by mouth daily.   TERBINAFINE EX 1,000 mg.   No facility-administered encounter medications on file as of 05/11/2022.    PAST MEDICAL HISTORY: Past Medical History:  Diagnosis Date   Arthritis    Depression    Hyperlipidemia    past hx of but not now    Smoker    1/2 pack per week-former smojker 06-15-15    PAST SURGICAL HISTORY: Past Surgical History:  Procedure Laterality Date   COLONOSCOPY     last colon 17 years  ago normal per pt    DENTAL SURGERY      ALLERGIES: No Known Allergies  FAMILY HISTORY: Family History  Problem Relation Age of Onset   Parkinsonism Mother 61       DIED W PARKINSON'S   Hypertension Father    Heart disease Father    Colon polyps Father    Dementia Father    Diabetes Brother    Colon cancer Neg Hx    Esophageal cancer Neg Hx    Rectal cancer Neg Hx    Stomach cancer Neg Hx    Pancreatic cancer Neg Hx    Breast cancer Neg Hx     SOCIAL HISTORY: Social History   Tobacco Use   Smoking status: Former   Smokeless tobacco: Never  Scientific laboratory technician Use: Never used  Substance Use Topics   Alcohol use: Yes    Alcohol/week: 6.0 standard drinks of alcohol    Types: 6 Glasses of wine per week    Comment: OCC   Drug use: No   Social History   Social History Narrative   Not on file     OBJECTIVE: PHYSICAL EXAM: There were no vitals taken for this visit.  General:*** General appearance: Awake and alert. No distress. Cooperative with exam.  Skin: No obvious rash or jaundice. HEENT: Atraumatic. Anicteric. Lungs: Non-labored breathing on room air  Heart: Regular Abdomen: Soft, non tender. Extremities: No edema. No obvious deformity.  Musculoskeletal: No obvious joint swelling. Psych: Affect appropriate.  Neurological: Mental Status: Alert. Speech fluent. No pseudobulbar affect Cranial Nerves: CNII: No RAPD. Visual fields grossly intact. CNIII, IV, VI: PERRL. No nystagmus. EOMI. CN V: Facial sensation intact bilaterally to fine touch. [place] clench strong. Jaw jerk***. CN VII: Facial muscles symmetric and strong. No ptosis at rest or after sustained upgaze***. CN VIII: Hearing grossly intact bilaterally. CN IX: No hypophonia. CN X: Palate elevates symmetrically. CN XI: Full strength shoulder shrug bilaterally. CN XII: Tongue protrusion full and midline. No atrophy or fasciculations. No significant dysarthria*** Motor: Tone is ***. *** fasciculations in ***  extremities. *** atrophy. No grip or percussive myotonia.***  Individual muscle group testing (MRC grade out of 5):  Movement     Neck flexion ***    Neck extension ***     Right Left   Shoulder abduction *** ***   Shoulder adduction *** ***   Shoulder ext rotation *** ***   Shoulder int rotation *** ***   Elbow flexion *** ***   Elbow extension *** ***   Wrist extension *** ***   Wrist flexion *** ***   Finger abduction - FDI *** ***   Finger abduction - ADM *** ***   Finger extension *** ***   Finger distal flexion - 2/3 *** ***   Finger distal flexion - 4/5 *** ***   Thumb flexion - FPL *** ***   Thumb abduction - APB *** ***    Hip flexion *** ***   Hip extension *** ***   Hip adduction *** ***   Hip abduction *** ***   Knee extension *** ***   Knee flexion *** ***   Dorsiflexion *** ***   Plantarflexion *** ***   Inversion *** ***   Eversion *** ***   Great toe extension *** ***   Great toe flexion *** ***     Reflexes:  Right Left   Bicep *** ***   Tricep *** ***   BrRad *** ***  Knee *** ***   Ankle *** ***    Pathological Reflexes: Babinski: *** response bilaterally*** Hoffman: *** Troemner: *** Pectoral: *** Palmomental: *** Facial: *** Midline tap: *** Sensation: Pinprick: *** Vibration: *** Temperature: *** Proprioception: *** Coordination: Intact finger-to- nose-finger bilaterally. Romberg negative.*** Gait: Able to rise from chair with arms crossed unassisted. Normal, narrow-based gait. Able to tandem walk. Able to walk on toes and heels.***  Lab and Test Review: Internal labs: 09/01/21: CMP unremarkable A1c: 5.4 ***  External labs: TSH (2021): 1.650 ***  Imaging: ***  ASSESSMENT: Monica Hood is a 67 y.o. female who presents for evaluation of ***. *** has a relevant medical history of ***. *** neurological examination is pertinent for ***. Available diagnostic data is significant for ***. This constellation of symptoms and  objective data would most likely localize to ***. ***  PLAN: -Blood work: *** ***  -Return to clinic ***  The impression above as well as the plan as outlined below were extensively discussed with the patient (in the company of ***) who voiced understanding. All questions were answered to their satisfaction.  The patient was counseled on pertinent fall precautions per the printed material provided today, and as noted under the "Patient Instructions" section below.***  When available, results of the above investigations and possible further recommendations will be communicated to the patient via telephone/MyChart. Patient to call office if not contacted after expected testing turnaround time.   Total time spent reviewing records, interview, history/exam, documentation, and coordination of care on day of encounter:  *** min   Thank you for allowing me to participate in patient's care.  If I can answer any additional questions, I would be pleased to do so.  Kai Levins, MD   CC: Martin[place], Betty G, MD [address] [place] [address]  CC: Referring provider: Biagio Borg, MD [address] [place],  [place] [address]

## 2022-05-11 ENCOUNTER — Ambulatory Visit: Payer: Commercial Managed Care - HMO | Admitting: Neurology

## 2022-05-11 ENCOUNTER — Other Ambulatory Visit: Payer: Self-pay

## 2022-05-11 DIAGNOSIS — M170 Bilateral primary osteoarthritis of knee: Secondary | ICD-10-CM

## 2022-05-11 MED ORDER — MELOXICAM 7.5 MG PO TABS: 7.5000 mg | ORAL_TABLET | Freq: Every day | ORAL | 1 refills | Status: DC

## 2022-05-15 NOTE — Progress Notes (Unsigned)
Initial neurology clinic note  SERVICE DATE: 05/16/22  Reason for Evaluation: Consultation requested by Monica Borg, MD for an opinion regarding right foot numbness. My final recommendations will be communicated back to the requesting physician by way of shared medical record or letter to requesting physician via [place] mail.  HPI: This is [name], a 67 y.o. right-handed female with a medical history of HLD, OA, depression, former smoker who presents to neurology clinic with the chief complaint of right foot numbness. The patient is alone today.   Patient first noticed tingling and numbness in her right foot on 03/21/22. She denies weakness, imbalance, or falls. She mentions that she had some vein issues in her lower legs which she thinks may be related. This weekend (05/12/22) she noticed her right leg was more red then normal. She also noticed a "spot" on her right shin. She feels better with elevation of her leg. She denies back pain.  She denies symptoms in her left lower limb or either upper limb.  Patient is on Cymbalta 30 mg for depression. She had previously been on 60 mg but lowered due to feeling off on the medication.   She report any constitutional symptoms like fever, night sweats, anorexia or unintentional weight loss.  EtOH use: 2-3 beers or wine on weekend days, maybe 1 drink on week days Restrictive diet? No Family history of neuropathy/myopathy/neurologic disease? Mother had parkinson's disease, father with dementia late in life  Patient has never had an EMG.   MEDICATIONS:  Outpatient Encounter Medications as of 05/16/2022  Medication Sig   atorvastatin (LIPITOR) 10 MG tablet Take 1 tablet (10 mg total) by mouth daily.   chlorhexidine (PERIDEX) 0.12 % solution    DULoxetine (CYMBALTA) 30 MG capsule Take 1 capsule by mouth daily.   fluticasone (FLONASE) 50 MCG/ACT nasal spray Place 2 sprays into both nostrils daily.   meloxicam (MOBIC) 7.5 MG tablet Take 1  tablet (7.5 mg total) by mouth daily.   augmented betamethasone dipropionate (DIPROLENE-AF) 0.05 % cream Apply topically 2 (two) times daily as needed. (Patient not taking: Reported on 05/16/2022)   DULoxetine (CYMBALTA) 30 MG capsule TAKE 1 CAPSULE(30 MG) BY MOUTH DAILY (Patient not taking: Reported on 05/16/2022)   TERBINAFINE EX 1,000 mg. (Patient not taking: Reported on 05/16/2022)   No facility-administered encounter medications on file as of 05/16/2022.    PAST MEDICAL HISTORY: Past Medical History:  Diagnosis Date   Arthritis    Depression    Hyperlipidemia    past hx of but not now    Smoker    1/2 pack per week-former smojker 06-15-15    PAST SURGICAL HISTORY: Past Surgical History:  Procedure Laterality Date   COLONOSCOPY     last colon 17 years ago normal per pt   DENTAL SURGERY      ALLERGIES: No Known Allergies  FAMILY HISTORY: Family History  Problem Relation Age of Onset   Parkinsonism Mother 22       DIED W PARKINSON'S   Hypertension Father    Heart disease Father    Colon polyps Father    Dementia Father    Diabetes Brother    Colon cancer Neg Hx    Esophageal cancer Neg Hx    Rectal cancer Neg Hx    Stomach cancer Neg Hx    Pancreatic cancer Neg Hx    Breast cancer Neg Hx     SOCIAL HISTORY: Social History   Tobacco Use  Smoking status: Former   Smokeless tobacco: Never  Vaping Use   Vaping Use: Never used  Substance Use Topics   Alcohol use: Yes    Alcohol/week: 6.0 standard drinks of alcohol    Types: 6 Glasses of wine per week    Comment: every other day   Drug use: No   Social History   Social History Narrative   Are you right handed or left handed? right   Are you currently employed ? 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 yes   Who lives with you?    What type of home do you live in: 1 story or 2 story? two   Caffiene 1 - 2 cups      OBJECTIVE: PHYSICAL EXAM: BP 105/73   Pulse 87    Ht 5' 6"$  (1.676 m)   Wt 149 lb (67.6 kg)   SpO2 97%   BMI 24.05 kg/m   General: General appearance: Awake and alert. No distress. Cooperative with exam.  HEENT: Atraumatic. Anicteric. Lungs: Non-labored breathing on room air  Extremities: No edema. No obvious deformity.  Psych: Affect appropriate.  Neurological: Mental Status: Alert. Speech fluent. No pseudobulbar affect Cranial Nerves: CNII: No RAPD. Visual fields grossly intact. CNIII, IV, VI: Right pupil 68m, left 3 mm, reactive. No nystagmus. EOMI. CN V: Facial sensation intact bilaterally to fine touch. CN VII: Facial muscles symmetric and strong. Right ptosis at rest. CN VIII: Hearing grossly intact bilaterally. CN IX: No hypophonia. CN X: Palate elevates symmetrically. CN XI: Full strength shoulder shrug bilaterally. CN XII: Tongue protrusion full and midline. No atrophy or fasciculations. No significant dysarthria Motor: Tone is normal. No atrophy.  Individual muscle group testing (MRC grade out of 5):  Movement     Neck flexion 5    Neck extension 5     Right Left   Shoulder abduction 5 5   Elbow flexion 5 5   Elbow extension 5 5   Finger extension 5 5   Finger flexion 5 5    Hip flexion 5 5   Hip extension 5 5   Hip adduction 5 5   Hip abduction 5 5   Knee extension 5 5   Knee flexion 5 5   Dorsiflexion 5 5   Plantarflexion 5 5   Inversion 5 5   Eversion 5 5   Great toe extension 5- 5-   Great toe flexion 5 5     Reflexes:  Right Left   Bicep 2+ 2+   Tricep 2+ 2+   BrRad 2+ 2+   Knee 2+ 2+   Ankle 1+ 1+    Pathological Reflexes: Babinski: flexor response bilaterally Hoffman: absent bilaterally Troemner: absent bilaterally Sensation: Pinprick: Intact in all extremities Vibration: Intact in all extremities Proprioception: Intact in bilateral great toes Coordination: Intact finger-to- nose-finger bilaterally. Romberg negative. Gait: Able to rise from chair with arms crossed unassisted.  Normal, narrow-based gait. Able to tandem walk. Able to walk on toes and heels.  Lab and Test Review: Internal labs: 09/01/21: CMP unremarkable A1c: 5.4   External labs: TSH (2021): 1.650  ASSESSMENT: Monica ESTEPPis a 67y.o. female who presents for evaluation of numbness and tingling in right foot. She has a relevant medical history of HLD, OA, depression, former smoker. Her neurological examination is essentially normal today. Available diagnostic data is significant for HbA1c of 5.4. The etiology of patient's symptoms is  not currently clear. Radiculopathy or a mononeuropathy are possible, but patient does not have back pain, making radiculopathy less likely, and exam does not show any abnormalities to point to a specific mononeuropathy. A generalized polyneuropathy is less likely as symptoms are only in RLE per patient. I will get an EMG to look for peripheral nerve process.  PLAN: -EMG - RLE  -Lidocaine cream PRN -Continue Cymbalta 30 mg daily, could consider increasing to 60 mg  -Return to clinic to be determined  The impression above as well as the plan as outlined below were extensively discussed with the patient who voiced understanding. All questions were answered to their satisfaction.  When available, results of the above investigations and possible further recommendations will be communicated to the patient via telephone/MyChart. Patient to call office if not contacted after expected testing turnaround time.   Total time spent reviewing records, interview, history/exam, documentation, and coordination of care on day of encounter:  40 min   Thank you for allowing me to participate in patient's care.  If I can answer any additional questions, I would be pleased to do so.  Kai Levins, MD   CC: Martinique, Betty G, MD [address] [place] [address]  CC: Referring provider: Biagio Borg, MD [address] [place],  [place] [address]

## 2022-05-16 ENCOUNTER — Encounter: Payer: Self-pay | Admitting: Neurology

## 2022-05-16 ENCOUNTER — Ambulatory Visit: Payer: 59 | Admitting: Neurology

## 2022-05-16 VITALS — BP 105/73 | HR 87 | Ht 66.0 in | Wt 149.0 lb

## 2022-05-16 DIAGNOSIS — R209 Unspecified disturbances of skin sensation: Secondary | ICD-10-CM | POA: Diagnosis not present

## 2022-05-16 DIAGNOSIS — R202 Paresthesia of skin: Secondary | ICD-10-CM | POA: Diagnosis not present

## 2022-05-16 NOTE — Patient Instructions (Addendum)
I saw you today for right foot numbness and tingling. This could be due to a nerve problem. To look into this further, I recommend a nerve test called an EMG (see more information below). You can schedule this today.  Continue Cymbalta as this can help with nerve pain. If needed, we could consider increasing to 60 mg in the future.  You can also try Lidocaine cream as needed. Apply wear you have pain, tingling, or burning. Wear gloves to prevent your hands being numb. This can be bought over the counter at any drug store or online.  The physicians and staff at [facility] are committed to providing excellent care. You may receive a survey requesting feedback about your experience at our office. We strive to receive "very good" responses to the survey questions. If you feel that your experience would prevent you from giving the office a "very good " response, please contact our office to try to remedy the situation. We may be reached at [phone]. Thank you for taking the time out of your busy day to complete the survey.  Kai Levins, MD [facility]  ELECTROMYOGRAM AND NERVE CONDUCTION STUDIES (EMG/NCS) INSTRUCTIONS  How to Prepare The neurologist conducting the EMG will need to know if you have certain medical conditions. Tell the neurologist and other EMG lab personnel if you: Have a pacemaker or any other electrical medical device Take blood-thinning medications Have hemophilia, a blood-clotting disorder that causes prolonged bleeding Bathing Take a shower or bath shortly before your exam in order to remove oils from your skin. Don't apply lotions or creams before the exam.  What to Expect You'll likely be asked to change into a hospital gown for the procedure and lie down on an examination table. The following explanations can help you understand what will happen during the exam.  Electrodes. The neurologist or a technician places surface electrodes at various locations on  your skin depending on where you're experiencing symptoms. Or the neurologist may insert needle electrodes at different sites depending on your symptoms.  Sensations. The electrodes will at times transmit a tiny electrical current that you may feel as a twinge or spasm. The needle electrode may cause discomfort or pain that usually ends shortly after the needle is removed. If you are concerned about discomfort or pain, you may want to talk to the neurologist about taking a short break during the exam.  Instructions. During the needle EMG, the neurologist will assess whether there is any spontaneous electrical activity when the muscle is at rest - activity that isn't present in healthy muscle tissue - and the degree of activity when you slightly contract the muscle.  He or she will give you instructions on resting and contracting a muscle at appropriate times. Depending on what muscles and nerves the neurologist is examining, he or she may ask you to change positions during the exam.  After your EMG You may experience some temporary, minor bruising where the needle electrode was inserted into your muscle. This bruising should fade within several days. If it persists, contact your primary care doctor.

## 2022-05-25 ENCOUNTER — Ambulatory Visit: Payer: Commercial Managed Care - HMO | Admitting: Neurology

## 2022-06-06 NOTE — Progress Notes (Unsigned)
ACUTE VISIT Chief Complaint  Patient presents with   knot on foot    Top of right foot, noticed on Tuesday.    HPI: [name] is a 67 y.o. female, who is here today with above complaint. Bone prominence on medial aspect of foot, dorsum,which she noticed three days ago. She reports that the knot was initially pinkish in color, possibly due to standing all day and area being rubbed by shoe wear. She denies any associated pain or discomfort and has not observed any growth since its initial appearance.  She mentions that she has a scheduled nerve study/EMG for tingling in her right foot. She has been paying more attention to her foot due to this issue. She is unsure if this is a new development or if it has been present for some time and just noted now. She follows with neurologist.  Review of Systems  Constitutional:  Negative for chills, fever and unexpected weight change.  Respiratory:  Negative for shortness of breath.   Cardiovascular:  Negative for chest pain and leg swelling.  Gastrointestinal:  Negative for abdominal pain, nausea and vomiting.  Musculoskeletal:  Negative for gait problem and joint swelling.  Skin:  Negative for rash.  See other pertinent positives and negatives in HPI.  Current Outpatient Medications on File Prior to Visit  Medication Sig Dispense Refill   atorvastatin (LIPITOR) 10 MG tablet Take 1 tablet (10 mg total) by mouth daily. 90 tablet 3   chlorhexidine (PERIDEX) 0.12 % solution      DULoxetine (CYMBALTA) 30 MG capsule Take 1 capsule by mouth daily.     fluticasone (FLONASE) 50 MCG/ACT nasal spray Place 2 sprays into both nostrils daily. 48 g 1   meloxicam (MOBIC) 7.5 MG tablet Take 1 tablet (7.5 mg total) by mouth daily. 90 tablet 1   augmented betamethasone dipropionate (DIPROLENE-AF) 0.05 % cream Apply topically 2 (two) times daily as needed. (Patient not taking: Reported on 05/16/2022)     DULoxetine (CYMBALTA) 30 MG capsule TAKE 1 CAPSULE(30  MG) BY MOUTH DAILY (Patient not taking: Reported on 05/16/2022) 90 capsule 3   TERBINAFINE EX 1,000 mg. (Patient not taking: Reported on 05/16/2022)     No current facility-administered medications on file prior to visit.    Past Medical History:  Diagnosis Date   Arthritis    Depression    Hyperlipidemia    past hx of but not now    Smoker    1/2 pack per week-former smojker 06-15-15   No Known Allergies  Social History   Socioeconomic History   Marital status: Single    Spouse name: Not on file   Number of children: Not on file   Years of education: Not on file   Highest education level: Not on file  Occupational History   Not on file  Tobacco Use   Smoking status: Former   Smokeless tobacco: Never  Vaping Use   Vaping Use: Never used  Substance and Sexual Activity   Alcohol use: Yes    Alcohol/week: 6.0 standard drinks of alcohol    Types: 6 Glasses of wine per week    Comment: every other day   Drug use: No   Sexual activity: Not Currently    Comment: 1st intercourse 67 yo-5 partners  Other Topics Concern   Not on file  Social History Narrative   Are you right handed or left handed? right   Are you currently employed ? 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 yes   Who lives with you?    What type of home do you live in: 1 story or 2 story? two   Caffiene 1 - 2 cups    Social Determinants of Health   Financial Resource Strain: Not on file  Food Insecurity: Not on file  Transportation Needs: Not on file  Physical Activity: Not on file  Stress: Not on file  Social Connections: Not on file    Vitals:   06/08/22 0731  BP: 110/70  Pulse: 88  Resp: 16  Temp: 97.6 F (36.4 C)  SpO2: 96%   Body mass index is 23.44 kg/m.  Physical Exam Vitals and nursing note reviewed.  Constitutional:      General: She is not in acute distress.    Appearance: She is not ill-appearing.  HENT:     Head: Normocephalic and  atraumatic.  Eyes:     Conjunctiva/sclera: Conjunctivae normal.  Cardiovascular:     Rate and Rhythm: Normal rate and regular rhythm.  Pulmonary:     Effort: Pulmonary effort is normal. No respiratory distress.  Musculoskeletal:     Right foot: Normal range of motion and normal capillary refill. Bunion present. No tenderness or bony tenderness. Normal pulse.     Left foot: Normal capillary refill. Bunion present. No tenderness or bony tenderness. Normal pulse.       Feet:  Neurological:     Mental Status: She is alert.     Gait: Gait normal.   ASSESSMENT AND PLAN: Foot mass, right -     DG Foot Complete Right; Future  Hard prominence medial dorsum right foot. Upon examination, noted similar, albeit smaller, prominence on her left foot. History and examination do not suggest a serious process. ? Bone benign prominence with possible associated ganglion.  Return if symptoms worsen or fail to improve, for keep next appointment.  Kasim Mccorkle G. Martinique, MD  [facility]. [place] office.

## 2022-06-08 ENCOUNTER — Encounter: Payer: Self-pay | Admitting: Family Medicine

## 2022-06-08 ENCOUNTER — Ambulatory Visit: Payer: 59 | Admitting: Family Medicine

## 2022-06-08 ENCOUNTER — Telehealth: Payer: Self-pay | Admitting: Family Medicine

## 2022-06-08 VITALS — BP 110/70 | HR 88 | Temp 97.6°F | Resp 16 | Ht 66.0 in | Wt 145.2 lb

## 2022-06-08 DIAGNOSIS — R2241 Localized swelling, mass and lump, right lower limb: Secondary | ICD-10-CM | POA: Diagnosis not present

## 2022-06-08 NOTE — Telephone Encounter (Signed)
fyi

## 2022-06-08 NOTE — Patient Instructions (Signed)
A few things to remember from today's visit:  Foot mass, right - Plan: DG Foot Complete Right  I doe snot seem suspicious. ? Bony prominence vs ganglion. Continue monitoring for changes. If needed podiatrist evaluation can be arranged.  If you need refills for medications you take chronically, please call your pharmacy. Do not use My Chart to request refills or for acute issues that need immediate attention. If you send a my chart message, it may take a few days to be addressed, specially if I am not in the office.  Please be sure medication list is accurate. If a new problem present, please set up appointment sooner than planned today.

## 2022-06-08 NOTE — Telephone Encounter (Signed)
Pt was just seen by MD this morning. Pt called back to inform MD that she has changed her mind and will not be going for an xray.  Please advise.

## 2022-07-02 ENCOUNTER — Ambulatory Visit: Payer: Medicare HMO | Admitting: Neurology

## 2022-07-02 ENCOUNTER — Telehealth: Payer: Self-pay

## 2022-07-02 ENCOUNTER — Telehealth: Payer: Self-pay | Admitting: Neurology

## 2022-07-02 ENCOUNTER — Other Ambulatory Visit: Payer: Self-pay

## 2022-07-02 DIAGNOSIS — R202 Paresthesia of skin: Secondary | ICD-10-CM

## 2022-07-02 DIAGNOSIS — M5417 Radiculopathy, lumbosacral region: Secondary | ICD-10-CM

## 2022-07-02 DIAGNOSIS — M5416 Radiculopathy, lumbar region: Secondary | ICD-10-CM

## 2022-07-02 DIAGNOSIS — R209 Unspecified disturbances of skin sensation: Secondary | ICD-10-CM

## 2022-07-02 DIAGNOSIS — R200 Anesthesia of skin: Secondary | ICD-10-CM

## 2022-07-02 NOTE — Telephone Encounter (Signed)
-----   Message from Shellia Carwin, MD sent at 07/02/2022  8:54 AM EDT ----- Regarding: Orders and follow up appt Olyvia Gopal,  Can we order MRI lumbar spine without contrast for this patient? It is for lumbar radiculopathy and leg numbness/pain.   Can we also order PT for the same thing? Thank you.  Hinton Dyer, can we get this patient on my schedule to follow up in about 3-4 months?  Thank you,  Kai Levins, MD

## 2022-07-02 NOTE — Telephone Encounter (Signed)
Mr Lumbar Spine is going to be at [facility] and Pt was sent to [facility]. 07/02/22

## 2022-07-02 NOTE — Procedures (Signed)
[facility]  [place], [place]  [place], [place] [address] Tel: [phone] Fax: [phone] Test Date:  07/02/2022  Patient: Monica Hood DOB: 1955/08/10 Physician: Kai Levins, MD  Sex: Female Height: 5\' 6"  Ref Phys: Kai Levins, MD  ID#: [id number]   Technician:    History: This is a 67 year old female with numbness and tingling in the right foot.  NCV & EMG Findings: Extensive electrodiagnostic evaluation of the right lower limb with additional nerve conduction studies of the left lower limb shows: Bilateral superficial peroneal/fibular sensory responses are absent. Right sural sensory response is within normal limits. Right peroneal/fibular (EDB) and right tibial (AH) motor responses are within normal limits. Bilateral H reflex responses are absent. Chronic motor axon loss changes without active denervation changes are seen in the right tibialis anterior, flexor digitorum longus, gluteus medius, medial head of gastrocnemius, short head of biceps femoris, and lumbosacral paraspinal (L5 level) muscles.  Impression: This is an abnormal study. 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 No electrodiagnostic evidence of a large fiber sensorimotor neuropathy. Absent superficial peroneal/fibular sensory responses bilaterally are likely technical in nature.    ___________________________ Kai Levins, MD    Nerve Conduction Studies Motor Nerve Results    Latency Amplitude F-Lat Segment Distance CV Comment  Site (ms) Norm (mV) Norm (ms)  (cm) (m/s) Norm   Right Fibular (EDB) Motor  Ankle 3.5  < 6.0 4.7  > 2.5        Bel fib head 10.4 - 4.0 -  Bel fib head-Ankle 30 43  > 40   Pop fossa 12.2 - 4.0 -  Pop fossa-Bel fib head 8 44 -   Right Tibial (AH) Motor  Ankle 4.2  < 6.0 11.8  > 4.0        Knee 12.9 -  10.1 -  Knee-Ankle 39 45  > 40    Sensory Sites    Neg Peak Lat Amplitude (O-P) Segment Distance Velocity Comment  Site (ms) Norm (V) Norm  (cm) (ms)   Left Superficial Fibular Sensory  14 cm-Ankle *NR  < 4.6 *NR  > 3 14 cm-Ankle 14    Right Superficial Fibular Sensory  14 cm-Ankle *NR  < 4.6 *NR  > 3 14 cm-Ankle 14    Right Sural Sensory  Calf-Lat mall 3.8  < 4.6 11  > 3 Calf-Lat mall 14     H-Reflex Results    M-Lat H Lat H Neg Amp H-M Lat  Site (ms) (ms) Norm (mV) (ms)  Left Tibial H-Reflex  Pop fossa 5.6 ---  < 35.0 --- ---  Right Tibial H-Reflex  Pop fossa 5.9 ---  < 35.0 --- ---   Electromyography   Side Muscle Ins.Act Fibs Fasc Recrt Amp Dur Poly Activation Comment  Right Tib ant Nml Nml Nml *1- *1+ *1+ *1+ Nml N/A  Right Gastroc MH Nml Nml Nml *1- *1+ *1+ *1+ *Variable N/A  Right FDL Nml Nml Nml *2- *1+ *1+ *1+ Nml N/A  Right Rectus fem Nml Nml Nml Nml Nml Nml Nml Nml N/A  Right Biceps fem SH Nml Nml Nml *1- *1+ *1+ *1+ Nml N/A  Right Gluteus med Nml Nml Nml *1- *1+ *1+ *1+ Nml N/A  Right Lumbar PSP lower Nml Nml Nml *1- *1+ *1+ *1+ Nml N/A  Waveforms:  Motor      Sensory        H-Reflex

## 2022-07-02 NOTE — Telephone Encounter (Signed)
Discussed the results of patient's EMG after the procedure today. It showed evidence of the residuals of right L5 and S1 radiculopathy.   We discussed and patient agreed to MRI lumbar spine and PT for lumbar radiculopathy symptoms.  She will follow up 10/31/22.  All questions were answered.  Kai Levins, MD [facility]

## 2022-07-06 ENCOUNTER — Ambulatory Visit: Payer: Commercial Managed Care - HMO | Admitting: Neurology

## 2022-07-19 IMAGING — MG MM DIGITAL SCREENING BILAT W/ TOMO AND CAD
8 series · 9 of 24 positions shown · non-contrast
Comparison: Previous exam(s).

CLINICAL DATA: Screening.

EXAM:
DIGITAL SCREENING BILATERAL MAMMOGRAM WITH TOMOSYNTHESIS AND CAD
TECHNIQUE: Bilateral screening digital craniocaudal and mediolateral oblique
mammograms were obtained. Bilateral screening digital breast
tomosynthesis was performed. The images were evaluated with
computer-aided detection.

[L CC synth-2D]
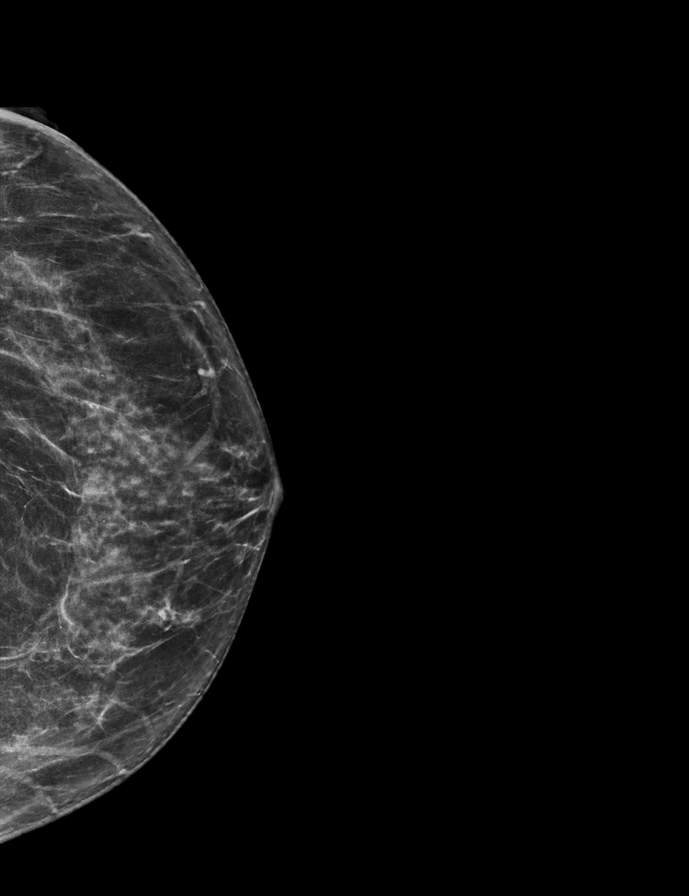

[R CC synth-2D]
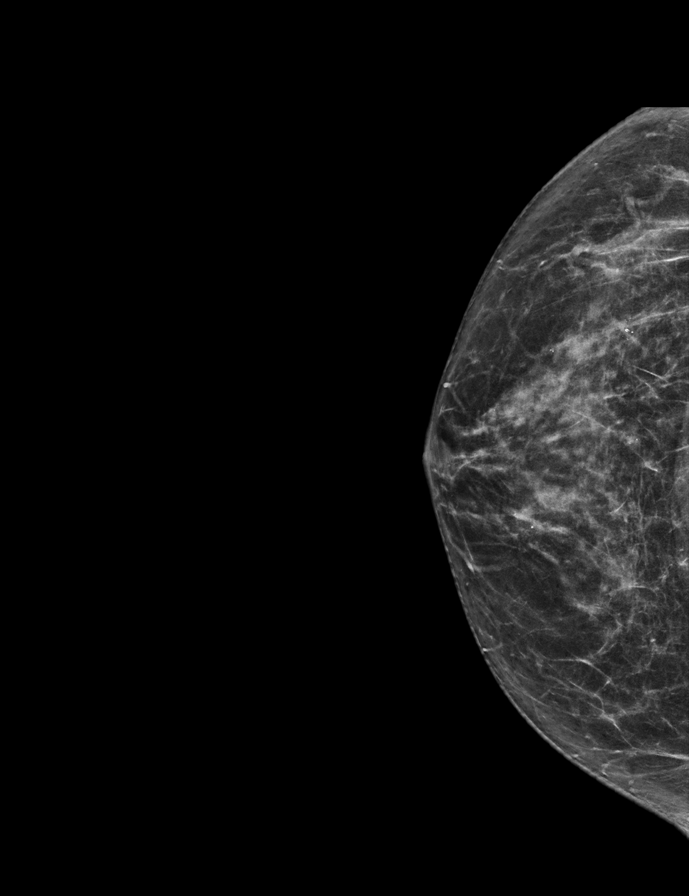

[R MLO synth-2D]
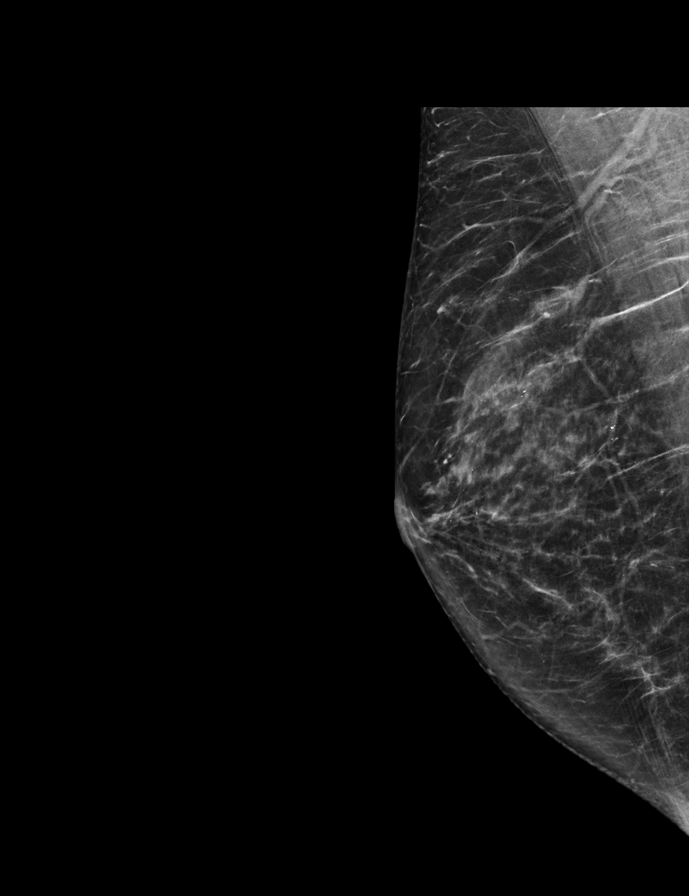

[L MLO synth-2D]
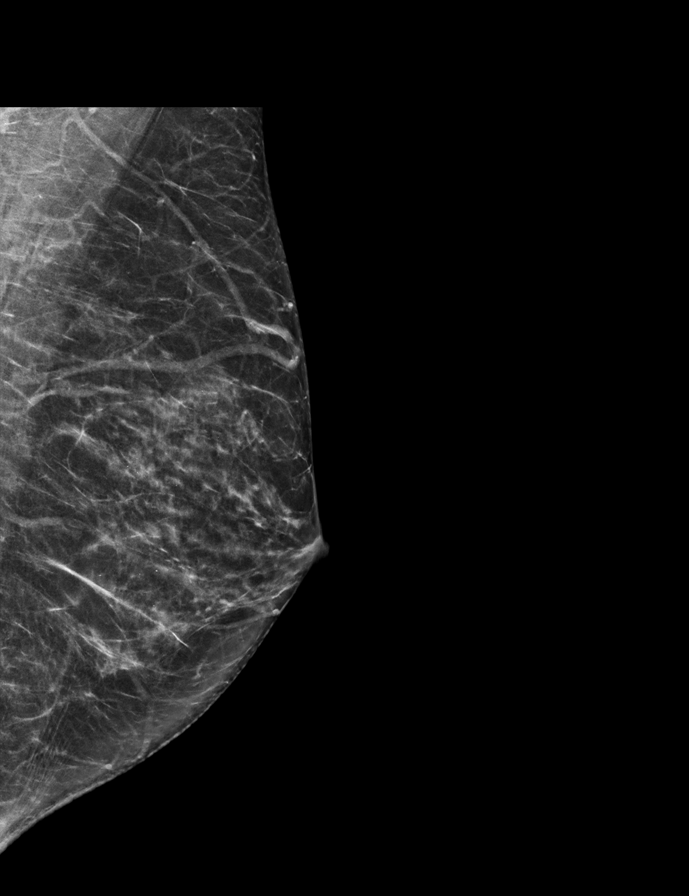

[R CC tomo · 2 of 62 frames shown]
[frame 21/62]
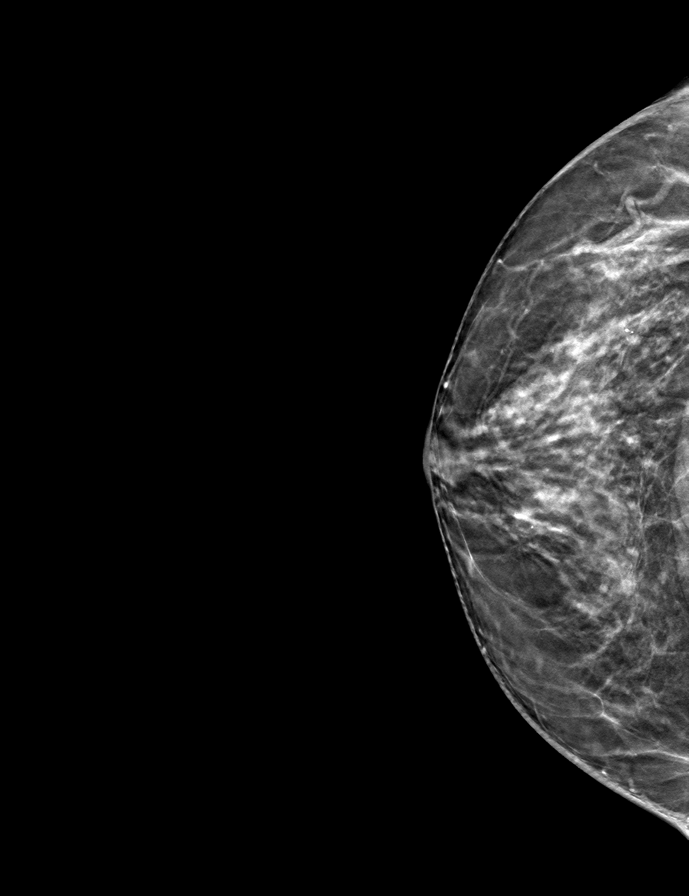
[frame 31/62]
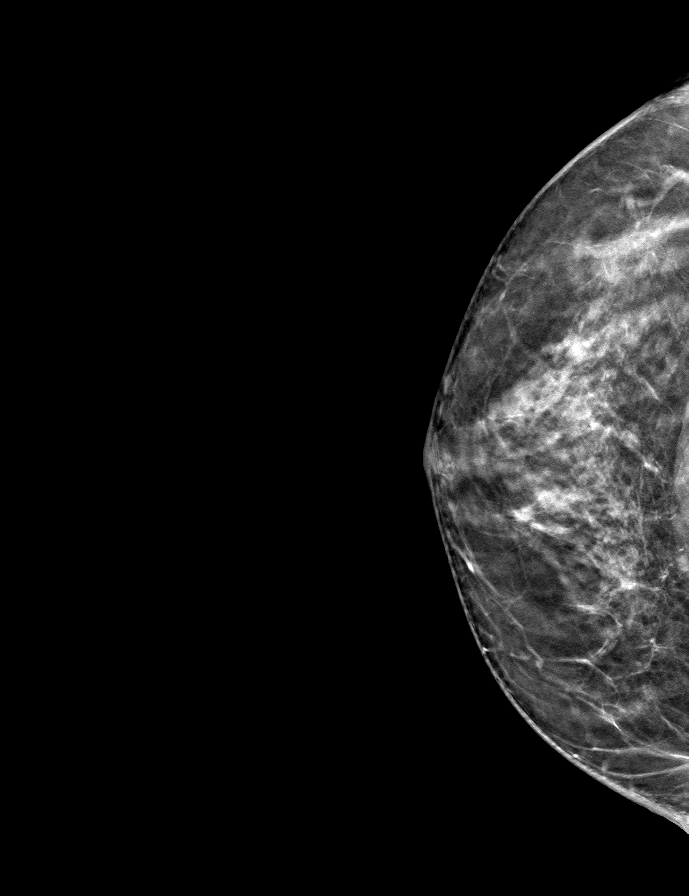

[L CC tomo · tomo slice 32/63.0]
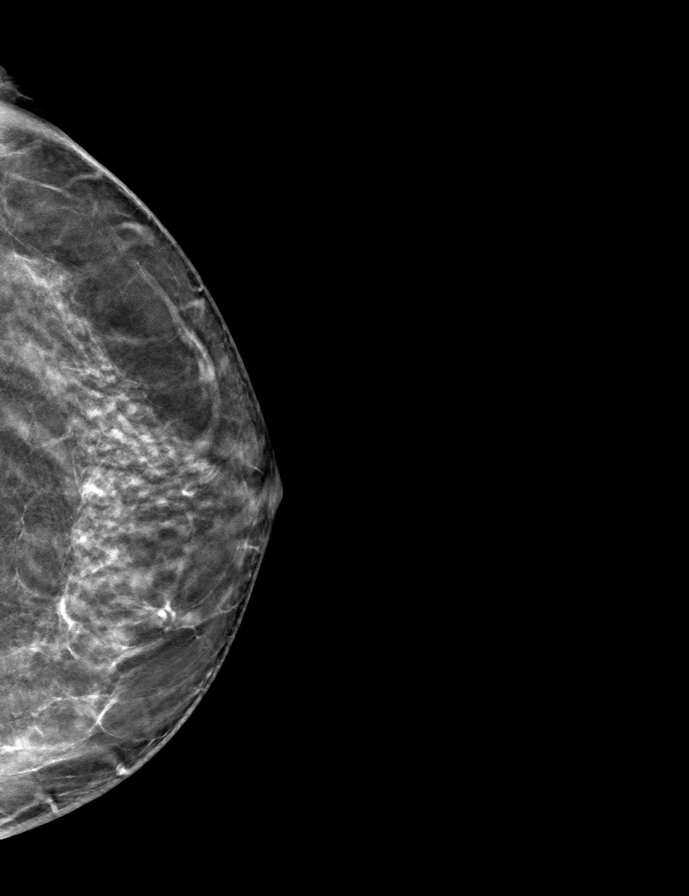

[L MLO tomo · tomo slice 31/61.0]
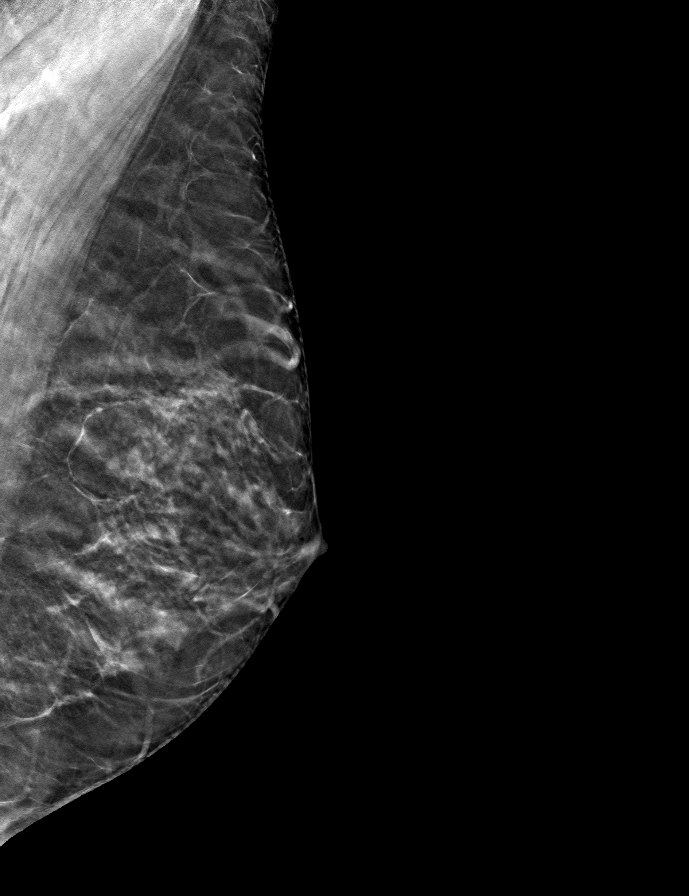

[R MLO tomo · tomo slice 32/63.0]
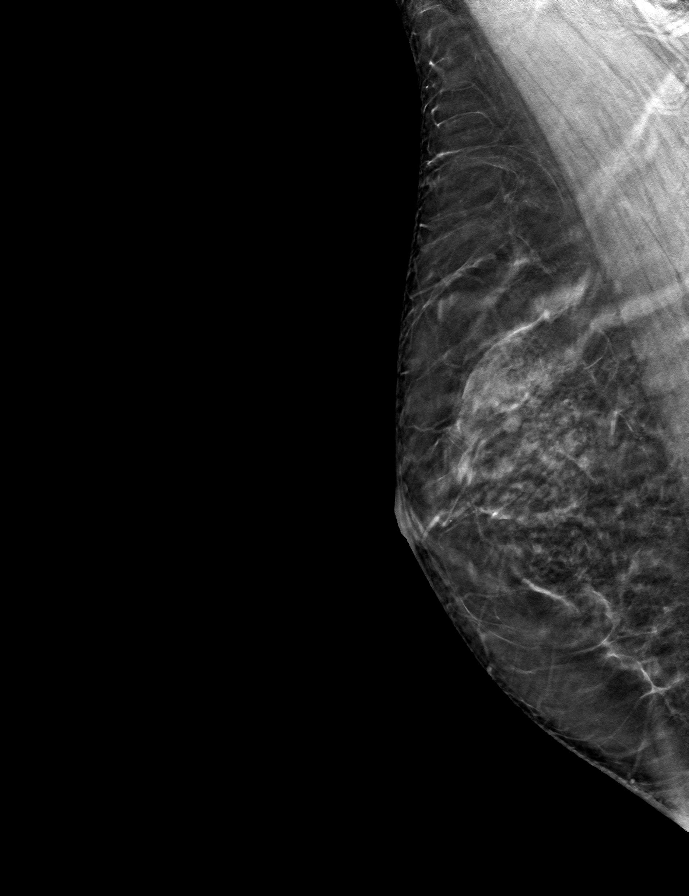

[9 of 24 positions shown; findings below may reference images not displayed]

ACR Breast Density Category b: There are scattered areas of
fibroglandular density.
FINDINGS: There are no findings suspicious for malignancy.
IMPRESSION: No mammographic evidence of malignancy. A result letter of this
screening mammogram will be mailed directly to the patient.

RECOMMENDATION:
Screening mammogram in one year. (Code:51-O-LD2)

BI-RADS CATEGORY  1: Negative.

## 2022-08-01 ENCOUNTER — Encounter: Payer: Self-pay | Admitting: Neurology

## 2022-08-02 ENCOUNTER — Telehealth: Payer: Self-pay | Admitting: Neurology

## 2022-08-02 NOTE — Telephone Encounter (Signed)
Monica Hood called in stating they denied the prior auth for the MRI Case Reference number [id number]

## 2022-08-03 ENCOUNTER — Other Ambulatory Visit: Payer: Medicare HMO

## 2022-08-03 NOTE — Telephone Encounter (Signed)
Received papers of approval today for MRI.

## 2022-08-04 ENCOUNTER — Other Ambulatory Visit: Payer: Commercial Managed Care - HMO

## 2022-08-10 ENCOUNTER — Other Ambulatory Visit: Payer: Self-pay

## 2022-08-10 DIAGNOSIS — F325 Major depressive disorder, single episode, in full remission: Secondary | ICD-10-CM

## 2022-08-10 MED ORDER — DULOXETINE HCL 30 MG PO CPEP: ORAL_CAPSULE | ORAL | 1 refills | Status: DC

## 2022-10-24 NOTE — Progress Notes (Deleted)
NEUROLOGY FOLLOW UP OFFICE NOTE  Monica Hood [id number]  Subjective:  Monica Hood is a 67 y.o. year old right-handed female with a medical history of HLD, OA, depression, former smoker who we last saw on 05/16/22.  To briefly review: Patient first noticed tingling and numbness in her right foot on 03/21/22. She denies weakness, imbalance, or falls. She mentions that she had some vein issues in her lower legs which she thinks may be related. This weekend (05/12/22) she noticed her right leg was more red then normal. She also noticed a "spot" on her right shin. She feels better with elevation of her leg. She denies back pain.   She denies symptoms in her left lower limb or either upper limb.   Patient is on Cymbalta 30 mg for depression. She had previously been on 60 mg but lowered due to feeling off on the medication.    She report any constitutional symptoms like fever, night sweats, anorexia or unintentional weight loss.   EtOH use: 2-3 beers or wine on weekend days, maybe 1 drink on week days Restrictive diet? No Family history of neuropathy/myopathy/neurologic disease? Mother had parkinson's disease, father with dementia late in life  Most recent Assessment and Plan (05/16/22): Monica Hood is a 66 y.o. female who presents for evaluation of numbness and tingling in right foot. She has a relevant medical history of HLD, OA, depression, former smoker. Her neurological examination is essentially normal today. Available diagnostic data is significant for HbA1c of 5.4. The etiology of patient's symptoms is not currently clear. Radiculopathy or a mononeuropathy are possible, but patient does not have back pain, making radiculopathy less likely, and exam does not show any abnormalities to point to a specific mononeuropathy. A generalized polyneuropathy is less likely as symptoms are only in RLE per patient. I will get an EMG to look for peripheral nerve process.   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 There was no evidence of a large fiber neuropathy. I recommended PT and MRI lumbar spine after the EMG. ***  MRI was approved on 08/03/22, but has not been done?***  PT***  Mention of depression in PT note***  MEDICATIONS:  Outpatient Encounter Medications as of 10/31/2022  Medication Sig   atorvastatin (LIPITOR) 10 MG tablet Take 1 tablet (10 mg total) by mouth daily.   augmented betamethasone dipropionate (DIPROLENE-AF) 0.05 % cream Apply topically 2 (two) times daily as needed. (Patient not taking: Reported on 05/16/2022)   chlorhexidine (PERIDEX) 0.12 % solution    DULoxetine (CYMBALTA) 30 MG capsule TAKE 1 CAPSULE(30 MG) BY MOUTH DAILY   fluticasone (FLONASE) 50 MCG/ACT nasal spray Place 2 sprays into both nostrils daily.   meloxicam (MOBIC) 7.5 MG tablet Take 1 tablet (7.5 mg total) by mouth daily.   TERBINAFINE EX 1,000 mg. (Patient not taking: Reported on 05/16/2022)   No facility-administered encounter medications on file as of 10/31/2022.    PAST MEDICAL HISTORY: Past Medical History:  Diagnosis Date   Arthritis    Depression    Hyperlipidemia    past hx of but not now    Smoker    1/2 pack per week-former smojker 06-15-15    PAST SURGICAL HISTORY: Past Surgical History:  Procedure Laterality Date   COLONOSCOPY  last colon 17 years ago normal per pt   DENTAL SURGERY      ALLERGIES: No Known Allergies  FAMILY HISTORY: Family History  Problem Relation Age of Onset   Parkinsonism Mother 70       DIED W PARKINSON'S   Hypertension Father    Heart disease Father    Colon polyps Father    Dementia Father    Diabetes Brother    Colon cancer Neg Hx    Esophageal cancer Neg Hx    Rectal cancer Neg Hx    Stomach cancer Neg Hx    Pancreatic  cancer Neg Hx    Breast cancer Neg Hx     SOCIAL HISTORY: Social History   Tobacco Use   Smoking status: Former   Smokeless tobacco: Never  Vaping Use   Vaping status: Never Used  Substance Use Topics   Alcohol use: Yes    Alcohol/week: 6.0 standard drinks of alcohol    Types: 6 Glasses of wine per week    Comment: every other day   Drug use: No   Social History   Social History Narrative   Are you right handed or left handed? right   Are you currently employed ? 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 yes   Who lives with you?    What type of home do you live in: 1 story or 2 story? two   Caffiene 1 - 2 cups       Objective:  Vital Signs:  There were no vitals taken for this visit.  ***  Labs and Imaging review: New results: EMG (07/02/22): NCV & EMG Findings: Extensive electrodiagnostic evaluation of the right lower limb with additional nerve conduction studies of the left lower limb shows: Bilateral superficial peroneal/fibular sensory responses are absent. Right sural sensory response is within normal limits. Right peroneal/fibular (EDB) and right tibial (AH) motor responses are within normal limits. Bilateral H reflex responses are absent. Chronic motor axon loss changes without active denervation changes are seen in the right tibialis anterior, flexor digitorum longus, gluteus medius, medial head of gastrocnemius, short head of biceps femoris, and lumbosacral paraspinal (L5 level) muscles.   Impression: This is an abnormal study. 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 No electrodiagnostic evidence of a large fiber sensorimotor neuropathy. Absent superficial peroneal/fibular sensory responses bilaterally are likely technical in nature.  Previously reviewed  results: 09/01/21: CMP unremarkable A1c: 5.4   External labs: TSH (2021): 1.650  Assessment/Plan:  This is Monica Hood, a 67 y.o. female with: ***   Plan: ***  Return to clinic in ***  Total time spent reviewing records, interview, history/exam, documentation, and coordination of care on day of encounter:  *** min  Jacquelyne Balint, MD

## 2022-10-29 NOTE — Progress Notes (Unsigned)
HPI: [name] is a 67 y.o. female, who is here today for her routine physical.  Last CPE: 09/01/21  Regular exercise 3 or more time per week: *** Following a healthy diet: ***  Chronic medical problems: Hyperlipidemia, depression, vein disease, and allergies among some.   Immunization History  Administered Date(s) Administered   Influenza Whole 04/11/2009   Influenza, Quadrivalent, Recombinant, Inj, Pf 03/13/2018   Influenza,inj,Quad PF,6+ Mos 12/18/2013   Influenza,inj,Quad PF,6-35 Mos 01/29/2019   PPD Test 12/13/2011, 12/23/2014   Td 04/02/2005   Tdap 04/05/2011, 09/01/2021   Zoster Recombinant(Shingrix) 05/22/2018, 11/06/2018   Health Maintenance  Topic Date Due   Medicare Annual Wellness (AWV)  Never done   COVID-19 Vaccine (1 - 2023-24 season) Never done   Pneumonia Vaccine 105+ Years old (1 of 1 - PCV) Never done   HIV Screening  09/01/2032 (Originally 07/22/1970)   INFLUENZA VACCINE  11/01/2022   PAP SMEAR-Modifier  03/04/2023   MAMMOGRAM  03/30/2024   Colonoscopy  06/30/2025   DTaP/Tdap/Td (4 - Td or Tdap) 09/02/2031   DEXA SCAN  Completed   Hepatitis C Screening  Completed   Zoster Vaccines- Shingrix  Completed   HPV VACCINES  Aged Out     HLD: She is on Atorvastatin 10 mg daily and low fat diet.  Lab Results  Component Value Date   CHOL 187 09/01/2021   HDL 57.30 09/01/2021   LDLCALC 112 (H) 09/01/2021   LDLDIRECT 153.1 07/06/2011   TRIG 85.0 09/01/2021   CHOLHDL 3 09/01/2021   Depression on Cymbalta 30 mg daily, which she feels like it is helping. Dx'ed in her early 47's.    03/30/2022    1:32 PM 09/01/2021    8:10 AM 08/24/2020   10:47 PM  Depression screen PHQ 2/9  Decreased Interest 0 1 0  Down, Depressed, Hopeless 0 1 0  PHQ - 2 Score 0 2 0  Altered sleeping 0 1 0  Tired, decreased energy 0 1 0  Change in appetite 0 0 0  Feeling bad or failure about yourself  0 1 0  Trouble concentrating 0 0 0  Moving slowly or fidgety/restless 0 0  0  Suicidal thoughts 0 0 0  PHQ-9 Score 0 5 0  Difficult doing work/chores Not difficult at all Not difficult at all Not difficult at all     Review of Systems  Current Outpatient Medications on File Prior to Visit  Medication Sig Dispense Refill   atorvastatin (LIPITOR) 10 MG tablet Take 1 tablet (10 mg total) by mouth daily. 90 tablet 3   augmented betamethasone dipropionate (DIPROLENE-AF) 0.05 % cream Apply topically 2 (two) times daily as needed. (Patient not taking: Reported on 05/16/2022)     chlorhexidine (PERIDEX) 0.12 % solution      DULoxetine (CYMBALTA) 30 MG capsule TAKE 1 CAPSULE(30 MG) BY MOUTH DAILY 90 capsule 1   fluticasone (FLONASE) 50 MCG/ACT nasal spray Place 2 sprays into both nostrils daily. 48 g 1   meloxicam (MOBIC) 7.5 MG tablet Take 1 tablet (7.5 mg total) by mouth daily. 90 tablet 1   TERBINAFINE EX 1,000 mg. (Patient not taking: Reported on 05/16/2022)     No current facility-administered medications on file prior to visit.    Past Medical History:  Diagnosis Date   Arthritis    Depression    Hyperlipidemia    past hx of but not now    Smoker    1/2 pack per week-former smojker 06-15-15  Past Surgical History:  Procedure Laterality Date   COLONOSCOPY     last colon 17 years ago normal per pt   DENTAL SURGERY      No Known Allergies  Family History  Problem Relation Age of Onset   Parkinsonism Mother 55       DIED W PARKINSON'S   Hypertension Father    Heart disease Father    Colon polyps Father    Dementia Father    Diabetes Brother    Colon cancer Neg Hx    Esophageal cancer Neg Hx    Rectal cancer Neg Hx    Stomach cancer Neg Hx    Pancreatic cancer Neg Hx    Breast cancer Neg Hx     Social History   Socioeconomic History   Marital status: Single    Spouse name: Not on file   Number of children: Not on file   Years of education: Not on file   Highest education level: Not on file  Occupational History   Not on file   Tobacco Use   Smoking status: Former   Smokeless tobacco: Never  Vaping Use   Vaping status: Never Used  Substance and Sexual Activity   Alcohol use: Yes    Alcohol/week: 6.0 standard drinks of alcohol    Types: 6 Glasses of wine per week    Comment: every other day   Drug use: No   Sexual activity: Not Currently    Comment: 1st intercourse 67 yo-5 partners  Other Topics Concern   Not on file  Social History Narrative   Are you right handed or left handed? right   Are you currently employed ? 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 yes   Who lives with you?    What type of home do you live in: 1 story or 2 story? two   Caffiene 1 - 2 cups    Social Determinants of Health   Financial Resource Strain: Not on file  Food Insecurity: Not on file  Transportation Needs: Not on file  Physical Activity: Not on file  Stress: Not on file  Social Connections: Not on file    There were no vitals filed for this visit. There is no height or weight on file to calculate BMI.  Wt Readings from Last 3 Encounters:  06/08/22 145 lb 4 oz (65.9 kg)  05/16/22 149 lb (67.6 kg)  03/30/22 151 lb (68.5 kg)    Physical Exam  ASSESSMENT AND PLAN: [name] was here today annual physical examination.  No orders of the defined types were placed in this encounter.   There are no diagnoses linked to this encounter.  There are no diagnoses linked to this encounter.  No follow-ups on file.  Jordain Radin G. [place], MD  [facility]. [place] [facility].

## 2022-10-30 ENCOUNTER — Encounter: Payer: Self-pay | Admitting: Family Medicine

## 2022-10-30 ENCOUNTER — Ambulatory Visit (INDEPENDENT_AMBULATORY_CARE_PROVIDER_SITE_OTHER): Payer: Medicare HMO | Admitting: Family Medicine

## 2022-10-30 VITALS — BP 120/80 | HR 85 | Temp 97.7°F | Resp 16 | Ht 66.0 in | Wt 146.4 lb

## 2022-10-30 DIAGNOSIS — E785 Hyperlipidemia, unspecified: Secondary | ICD-10-CM | POA: Diagnosis not present

## 2022-10-30 DIAGNOSIS — Z23 Encounter for immunization: Secondary | ICD-10-CM

## 2022-10-30 DIAGNOSIS — Z0000 Encounter for general adult medical examination without abnormal findings: Secondary | ICD-10-CM | POA: Diagnosis not present

## 2022-10-30 DIAGNOSIS — Z780 Asymptomatic menopausal state: Secondary | ICD-10-CM | POA: Diagnosis not present

## 2022-10-30 DIAGNOSIS — F3341 Major depressive disorder, recurrent, in partial remission: Secondary | ICD-10-CM

## 2022-10-30 DIAGNOSIS — R7989 Other specified abnormal findings of blood chemistry: Secondary | ICD-10-CM | POA: Diagnosis not present

## 2022-10-30 DIAGNOSIS — J309 Allergic rhinitis, unspecified: Secondary | ICD-10-CM

## 2022-10-30 LAB — COMPREHENSIVE METABOLIC PANEL
ALT: 13 U/L (ref 0–35)
AST: 18 U/L (ref 0–37)
Albumin: 4.4 g/dL (ref 3.5–5.2)
Alkaline Phosphatase: 77 U/L (ref 39–117)
BUN: 26 mg/dL — ABNORMAL HIGH (ref 6–23)
CO2: 30 mEq/L (ref 19–32)
Calcium: 9.5 mg/dL (ref 8.4–10.5)
Chloride: 103 mEq/L (ref 96–112)
Creatinine, Ser: 0.6 mg/dL (ref 0.40–1.20)
GFR: 94.29 mL/min (ref >60.00)
Glucose, Bld: 89 mg/dL (ref 70–99)
Potassium: 5.1 mEq/L (ref 3.5–5.1)
Sodium: 138 mEq/L (ref 135–145)
Total Bilirubin: 0.5 mg/dL (ref 0.2–1.2)
Total Protein: 7.3 g/dL (ref 6.0–8.3)

## 2022-10-30 LAB — CBC
HCT: 39.8 % (ref 36.0–46.0)
Hemoglobin: 13 g/dL (ref 12.0–15.0)
MCHC: 32.6 g/dL (ref 30.0–36.0)
MCV: 100.3 fl — ABNORMAL HIGH (ref 78.0–100.0)
Platelets: 281 10*3/uL (ref 150.0–400.0)
RBC: 3.97 Mil/uL (ref 3.87–5.11)
RDW: 13.6 % (ref 11.5–15.5)
WBC: 5.8 10*3/uL (ref 4.0–10.5)

## 2022-10-30 LAB — LIPID PANEL
Cholesterol: 185 mg/dL (ref 0–200)
HDL: 60 mg/dL (ref >39.00)
LDL Cholesterol: 113 mg/dL — ABNORMAL HIGH (ref 0–99)
NonHDL: 124.73
Total CHOL/HDL Ratio: 3
Triglycerides: 59 mg/dL (ref 0.0–149.0)
VLDL: 11.8 mg/dL (ref 0.0–40.0)

## 2022-10-30 MED ORDER — FLUTICASONE PROPIONATE 50 MCG/ACT NA SUSP: 2.0000 | Freq: Every day | NASAL | 3 refills | Status: DC

## 2022-10-30 NOTE — Assessment & Plan Note (Signed)
Continue atorvastatin 10 mg daily and low-fat diet. Further recommendation will be given according to lipid panel result. 

## 2022-10-30 NOTE — Assessment & Plan Note (Signed)
Problem has improved since she started nasal saline irrigations. Continue Flonase nasal spray daily as needed.

## 2022-10-30 NOTE — Patient Instructions (Addendum)
A few things to remember from today's visit:  Routine general medical examination at a health care facility  Abnormal CBC - Plan: CBC  Hyperlipidemia, unspecified hyperlipidemia type - Plan: Comprehensive metabolic panel, Lipid panel  Asymptomatic postmenopausal estrogen deficiency - Plan: DG Bone Density  Allergic rhinitis, unspecified seasonality, unspecified trigger - Plan: fluticasone (FLONASE) 50 MCG/ACT nasal spray  No changes today. Continue care with [facility] closer to you home, we will miss you. Arrange appt with gynecologist.  If you need refills for medications you take chronically, please call your pharmacy. Do not use My Chart to request refills or for acute issues that need immediate attention. If you send a my chart message, it may take a few days to be addressed, specially if I am not in the office.  Please be sure medication list is accurate. If a new problem present, please set up appointment sooner than planned today.  Health Maintenance, Female Adopting a healthy lifestyle and getting preventive care are important in promoting health and wellness. Ask your health care provider about: The right schedule for you to have regular tests and exams. Things you can do on your own to prevent diseases and keep yourself healthy. What should I know about diet, weight, and exercise? Eat a healthy diet  Eat a diet that includes plenty of vegetables, fruits, low-fat dairy products, and lean protein. Do not eat a lot of foods that are high in solid fats, added sugars, or sodium. Maintain a healthy weight Body mass index (BMI) is used to identify weight problems. It estimates body fat based on height and weight. Your health care provider can help determine your BMI and help you achieve or maintain a healthy weight. Get regular exercise Get regular exercise. This is one of the most important things you can do for your health. Most adults should: Exercise for at least 150  minutes each week. The exercise should increase your heart rate and make you sweat (moderate-intensity exercise). Do strengthening exercises at least twice a week. This is in addition to the moderate-intensity exercise. Spend less time sitting. Even light physical activity can be beneficial. Watch cholesterol and blood lipids Have your blood tested for lipids and cholesterol at 65 years of age, then have this test every 5 years. Have your cholesterol levels checked more often if: Your lipid or cholesterol levels are high. You are older than 65 years of age. You are at high risk for heart disease. What should I know about cancer screening? Depending on your health history and family history, you may need to have cancer screening at various ages. This may include screening for: Breast cancer. Cervical cancer. Colorectal cancer. Skin cancer. Lung cancer. What should I know about heart disease, diabetes, and high blood pressure? Blood pressure and heart disease High blood pressure causes heart disease and increases the risk of stroke. This is more likely to develop in people who have high blood pressure readings or are overweight. Have your blood pressure checked: Every 3-5 years if you are 66-17 years of age. Every year if you are 51 years old or older. Diabetes Have regular diabetes screenings. This checks your fasting blood sugar level. Have the screening done: Once every three years after age 64 if you are at a normal weight and have a low risk for diabetes. More often and at a younger age if you are overweight or have a high risk for diabetes. What should I know about preventing infection? Hepatitis B If you have  a higher risk for hepatitis B, you should be screened for this virus. Talk with your health care provider to find out if you are at risk for hepatitis B infection. Hepatitis C Testing is recommended for: Everyone born from [birth year] through 1963. Anyone with known risk factors  for hepatitis C. Sexually transmitted infections (STIs) Get screened for STIs, including gonorrhea and chlamydia, if: You are sexually active and are younger than 65 years of age. You are older than 65 years of age and your health care provider tells you that you are at risk for this type of infection. Your sexual activity has changed since you were last screened, and you are at increased risk for chlamydia or gonorrhea. Ask your health care provider if you are at risk. Ask your health care provider about whether you are at high risk for HIV. Your health care provider may recommend a prescription medicine to help prevent HIV infection. If you choose to take medicine to prevent HIV, you should first get tested for HIV. You should then be tested every 3 months for as long as you are taking the medicine. Pregnancy If you are about to stop having your period (premenopausal) and you may become pregnant, seek counseling before you get pregnant. Take 400 to 800 micrograms (mcg) of folic acid every day if you become pregnant. Ask for birth control (contraception) if you want to prevent pregnancy. Osteoporosis and menopause Osteoporosis is a disease in which the bones lose minerals and strength with aging. This can result in bone fractures. If you are 33 years old or older, or if you are at risk for osteoporosis and fractures, ask your health care provider if you should: Be screened for bone loss. Take a calcium or vitamin D supplement to lower your risk of fractures. Be given hormone replacement therapy (HRT) to treat symptoms of menopause. Follow these instructions at home: Alcohol use Do not drink alcohol if: Your health care provider tells you not to drink. You are pregnant, may be pregnant, or are planning to become pregnant. If you drink alcohol: Limit how much you have to: 0-1 drink a day. Know how much alcohol is in your drink. In the [place] one drink equals one 12 oz bottle of beer (355 mL),  one 5 oz glass of wine (148 mL), or one 1 oz glass of hard liquor (44 mL). Lifestyle Do not use any products that contain nicotine or tobacco. These products include cigarettes, chewing tobacco, and vaping devices, such as e-cigarettes. If you need help quitting, ask your health care provider. Do not use street drugs. Do not share needles. Ask your health care provider for help if you need support or information about quitting drugs. General instructions Schedule regular health, dental, and eye exams. Stay current with your vaccines. Tell your health care provider if: You often feel depressed. You have ever been abused or do not feel safe at home. Summary Adopting a healthy lifestyle and getting preventive care are important in promoting health and wellness. Follow your health care provider's instructions about healthy diet, exercising, and getting tested or screened for diseases. Follow your health care provider's instructions on monitoring your cholesterol and blood pressure. This information is not intended to replace advice given to you by your health care provider. Make sure you discuss any questions you have with your health care provider. Document Revised: 08/08/2020 Document Reviewed: 08/08/2020 Elsevier Patient Education  2024 [employer].

## 2022-10-30 NOTE — Assessment & Plan Note (Signed)
We discussed the importance of regular physical activity and healthy diet for prevention of chronic illness and/or complications. Preventive guidelines reviewed. Vaccination updated, Prevnar 20 given today. Instructed to arrange an appointment with her gynecologist to continue female preventive care, she thinks she is due for a Pap smear. Ca++ and vit D supplementation to continue. DEXA will be arranged. We discussed current recommendations in regard to alcohol intake, < 4 oz of wine daily. Next CPE in a year.

## 2022-10-30 NOTE — Assessment & Plan Note (Signed)
Problem is stable. She has tried to discontinue duloxetine but symptoms recur, so continue 30 mg daily. As far as symptoms are not present with current management, it is appropriate to continue following annually.

## 2022-10-31 ENCOUNTER — Ambulatory Visit: Payer: 59 | Admitting: Neurology

## 2022-11-19 ENCOUNTER — Other Ambulatory Visit: Payer: Self-pay | Admitting: Family Medicine

## 2022-11-19 DIAGNOSIS — M170 Bilateral primary osteoarthritis of knee: Secondary | ICD-10-CM

## 2022-11-19 DIAGNOSIS — E785 Hyperlipidemia, unspecified: Secondary | ICD-10-CM

## 2022-12-01 ENCOUNTER — Other Ambulatory Visit: Payer: Self-pay | Admitting: Family Medicine

## 2022-12-01 DIAGNOSIS — F325 Major depressive disorder, single episode, in full remission: Secondary | ICD-10-CM

## 2023-01-25 ENCOUNTER — Other Ambulatory Visit: Payer: Self-pay | Admitting: Family Medicine

## 2023-01-25 DIAGNOSIS — J309 Allergic rhinitis, unspecified: Secondary | ICD-10-CM

## 2023-02-25 ENCOUNTER — Encounter: Payer: Self-pay | Admitting: Obstetrics and Gynecology

## 2023-03-05 ENCOUNTER — Encounter: Payer: Self-pay | Admitting: Family Medicine

## 2023-03-05 ENCOUNTER — Ambulatory Visit: Payer: Medicare HMO | Admitting: Family Medicine

## 2023-03-05 VITALS — BP 120/76 | HR 82 | Temp 97.6°F | Ht 66.0 in | Wt 149.0 lb

## 2023-03-05 DIAGNOSIS — M170 Bilateral primary osteoarthritis of knee: Secondary | ICD-10-CM

## 2023-03-05 DIAGNOSIS — Z23 Encounter for immunization: Secondary | ICD-10-CM

## 2023-03-05 DIAGNOSIS — E7800 Pure hypercholesterolemia, unspecified: Secondary | ICD-10-CM | POA: Diagnosis not present

## 2023-03-05 DIAGNOSIS — F3341 Major depressive disorder, recurrent, in partial remission: Secondary | ICD-10-CM | POA: Diagnosis not present

## 2023-03-05 DIAGNOSIS — R5383 Other fatigue: Secondary | ICD-10-CM

## 2023-03-05 NOTE — Progress Notes (Signed)
New Patient Office Visit  Subjective    Patient ID: Monica Hood, female    DOB: 07-May-1955  Age: 67 y.o. MRN: [id number]  CC:  Chief Complaint  Patient presents with   Establish Care    Referral to therapist for depression    HPI Monica Hood presents to establish care  Previous PCP Betty [place]  Other providers:  OB/GYN  Eyes- Dr. [place] (?glaucoma) and cataracts Neurology- Dr. Loleta Chance   Hx of depression since age 58. On Cymbalta and it helps.   She would like to see a therapist. Last time she was in therapy was 2020.    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 Smoked 2 packs every 2 weeks.   Drinks wine, 2 glasses 4 times per week.    Chronic bilateral knee pain. Denies knee pain as long as taking meloxicam.  She has been taking meloxicam daily. Hx of knee injections without improvement.    C/o fatigue. Not new but does not know why she has fatigue.  Sleeps well.   HCL on statin     03/05/2023    7:59 AM 10/30/2022    8:00 AM 03/30/2022    1:32 PM 09/01/2021    8:10 AM 08/24/2020   10:47 PM  Depression screen PHQ 2/9  Decreased Interest 1 0 0 1 0  Down, Depressed, Hopeless 3 0 0 1 0  PHQ - 2 Score 4 0 0 2 0  Altered sleeping 2 0 0 1 0  Tired, decreased energy 3 0 0 1 0  Change in appetite 0 0 0 0 0  Feeling bad or failure about yourself  2 0 0 1 0  Trouble concentrating 0 0 0 0 0  Moving slowly or fidgety/restless 0 0 0 0 0  Suicidal thoughts 0 0 0 0 0  PHQ-9 Score 11 0 0 5 0  Difficult doing work/chores  Not difficult at all Not difficult at all Not difficult at all Not difficult at all     Outpatient Encounter Medications as of 03/05/2023  Medication Sig   atorvastatin (LIPITOR) 10 MG tablet TAKE 1 TABLET BY MOUTH ONCE DAILY   bimatoprost (LUMIGAN) 0.01 % SOLN 1 drop at bedtime.   chlorhexidine (PERIDEX) 0.12 % solution    DULoxetine (CYMBALTA) 30 MG capsule TAKE 1 CAPSULE(30 MG) BY MOUTH DAILY    fluticasone (FLONASE) 50 MCG/ACT nasal spray SPRAY 2 SPRAYS INTO EACH NOSTRIL EVERY DAY   meloxicam (MOBIC) 7.5 MG tablet TAKE 1 TABLET BY MOUTH EVERY DAY   No facility-administered encounter medications on file as of 03/05/2023.    Past Medical History:  Diagnosis Date   Arthritis    Depression    Hyperlipidemia    past hx of but not now    Smoker    1/2 pack per week-former smojker 06-15-15    Past Surgical History:  Procedure Laterality Date   COLONOSCOPY     last colon 17 years ago normal per pt   DENTAL SURGERY      Family History  Problem Relation Age of Onset   Parkinsonism Mother 17       DIED W [facility]   Hypertension Father    Heart disease Father    Colon polyps Father    Dementia Father    Diabetes Brother    Colon cancer Neg Hx    Esophageal cancer Neg Hx  Rectal cancer Neg Hx    Stomach cancer Neg Hx    Pancreatic cancer Neg Hx    Breast cancer Neg Hx     Social History   Socioeconomic History   Marital status: Single    Spouse name: Not on file   Number of children: Not on file   Years of education: Not on file   Highest education level: Not on file  Occupational History   Not on file  Tobacco Use   Smoking status: Former    Current packs/day: 0.00    Types: Cigarettes    Quit date: 2008    Years since quitting: 16.9   Smokeless tobacco: Never   Tobacco comments:    4 cig/day from age 63 until 2008.  Vaping Use   Vaping status: Never Used  Substance and Sexual Activity   Alcohol use: Yes    Alcohol/week: 6.0 standard drinks of alcohol    Types: 6 Glasses of wine per week    Comment: every other day   Drug use: No   Sexual activity: Not Currently    Comment: 1st intercourse 15 yo-5 partners  Other Topics Concern   Not on file  Social History Narrative   Are you right handed or left handed? right   Are you currently employed ? 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 yes   Who lives with  you?    What type of home do you live in: 1 story or 2 story? two   Caffiene 1 - 2 cups    Social Determinants of Health   Financial Resource Strain: Not on file  Food Insecurity: Not on file  Transportation Needs: Not on file  Physical Activity: Not on file  Stress: Not on file  Social Connections: Not on file  Intimate Partner Violence: Not on file    Review of Systems  Constitutional:  Negative for chills, fever, malaise/fatigue and weight loss.  Respiratory:  Negative for shortness of breath.   Cardiovascular:  Negative for chest pain, palpitations and leg swelling.  Gastrointestinal:  Negative for abdominal pain, constipation, diarrhea, nausea and vomiting.  Genitourinary:  Negative for dysuria, frequency and urgency.  Neurological:  Negative for dizziness, focal weakness and headaches.  Psychiatric/Behavioral:  Positive for depression. Negative for suicidal ideas. The patient is not nervous/anxious and does not have insomnia.         Objective    BP 120/76   Pulse 82   Temp 97.6 F (36.4 C) (Temporal)   Ht 5\' 6"  (1.676 m)   Wt 149 lb (67.6 kg)   SpO2 98%   BMI 24.05 kg/m   Physical Exam Constitutional:      General: She is not in acute distress.    Appearance: She is not ill-appearing.  Eyes:     Extraocular Movements: Extraocular movements intact.     Conjunctiva/sclera: Conjunctivae normal.  Cardiovascular:     Rate and Rhythm: Normal rate and regular rhythm.  Pulmonary:     Effort: Pulmonary effort is normal.     Breath sounds: Normal breath sounds.  Musculoskeletal:     Cervical back: Normal range of motion and neck supple.     Right lower leg: No edema.     Left lower leg: No edema.  Skin:    General: Skin is warm and dry.  Neurological:     General: No focal deficit present.     Mental  Status: She is alert and oriented to person, place, and time.  Psychiatric:        Attention and Perception: Attention normal.        Mood and Affect: Mood and  affect normal.        Speech: Speech normal.        Behavior: Behavior normal.        Thought Content: Thought content normal. Thought content does not include suicidal ideation.         Assessment & Plan:   Problem List Items Addressed This Visit     Hyperlipidemia - Primary   Relevant Orders   Lipid panel   Major depressive disorder, recurrent, in partial remission (HCC)   Relevant Orders   Ambulatory referral to Psychology   Osteoarthritis of both knees   Other Visit Diagnoses     Fatigue, unspecified type       Relevant Orders   CBC with Differential/Platelet   Comprehensive metabolic panel   TSH   T4, free   Ferritin   Vitamin B12   Need for influenza vaccination       Relevant Orders   Flu Vaccine Trivalent High Dose (Fluad) (Completed)      She is a pleasant 67 year old female who is new to the practice and here to establish care.  Depression-continue current medication.  Referral to psychology per request. Check labs to look for underlying etiology for fatigue. Recommend limiting use of meloxicam since she has been on this for years.  Check renal and hepatic function.  Refer to sports medicine or orthopedist as needed.  History of injections that were not beneficial per patient. Check lipids and continue statin therapy.  Low-fat diet.  Return for needs Welcome to Medicare with me in the next 6 months .   Hetty Blend, NP-C

## 2023-03-05 NOTE — Patient Instructions (Addendum)
Thank you for trusting [place] with your healthcare.   Try to cut back on meloxicam.   Please return for fasting labs in the next week.   [email or web address]   [email or web address]   [facility] Multiple locations [phone]     [facility] [address] [place], [place] [address]  Phone: [phone]   [facility]  [address], [place], [place] [address]  Phone: [phone]

## 2023-03-07 ENCOUNTER — Other Ambulatory Visit: Payer: Medicare HMO

## 2023-03-07 DIAGNOSIS — R5383 Other fatigue: Secondary | ICD-10-CM

## 2023-03-07 DIAGNOSIS — E7800 Pure hypercholesterolemia, unspecified: Secondary | ICD-10-CM | POA: Diagnosis not present

## 2023-03-07 LAB — CBC WITH DIFFERENTIAL/PLATELET
Basophils Absolute: 0.1 10*3/uL (ref 0.0–0.1)
Basophils Relative: 1 % (ref 0.0–3.0)
Eosinophils Absolute: 0.4 10*3/uL (ref 0.0–0.7)
Eosinophils Relative: 7.2 % — ABNORMAL HIGH (ref 0.0–5.0)
HCT: 42.2 % (ref 36.0–46.0)
Hemoglobin: 14.3 g/dL (ref 12.0–15.0)
Lymphocytes Relative: 33 % (ref 12.0–46.0)
Lymphs Abs: 1.7 10*3/uL (ref 0.7–4.0)
MCHC: 33.9 g/dL (ref 30.0–36.0)
MCV: 100.8 fL — ABNORMAL HIGH (ref 78.0–100.0)
Monocytes Absolute: 0.6 10*3/uL (ref 0.1–1.0)
Monocytes Relative: 11.9 % (ref 3.0–12.0)
Neutro Abs: 2.5 10*3/uL (ref 1.4–7.7)
Neutrophils Relative %: 46.9 % (ref 43.0–77.0)
Platelets: 299 10*3/uL (ref 150.0–400.0)
RBC: 4.18 Mil/uL (ref 3.87–5.11)
RDW: 13.3 % (ref 11.5–15.5)
WBC: 5.3 10*3/uL (ref 4.0–10.5)

## 2023-03-07 LAB — LIPID PANEL
Cholesterol: 175 mg/dL (ref 0–200)
HDL: 58.4 mg/dL (ref >39.00)
LDL Cholesterol: 107 mg/dL — ABNORMAL HIGH (ref 0–99)
NonHDL: 116.84
Total CHOL/HDL Ratio: 3
Triglycerides: 49 mg/dL (ref 0.0–149.0)
VLDL: 9.8 mg/dL (ref 0.0–40.0)

## 2023-03-07 LAB — COMPREHENSIVE METABOLIC PANEL
ALT: 12 U/L (ref 0–35)
AST: 18 U/L (ref 0–37)
Albumin: 4.5 g/dL (ref 3.5–5.2)
Alkaline Phosphatase: 82 U/L (ref 39–117)
BUN: 21 mg/dL (ref 6–23)
CO2: 30 meq/L (ref 19–32)
Calcium: 9.7 mg/dL (ref 8.4–10.5)
Chloride: 105 meq/L (ref 96–112)
Creatinine, Ser: 0.67 mg/dL (ref 0.40–1.20)
GFR: 91.59 mL/min (ref >60.00)
Glucose, Bld: 97 mg/dL (ref 70–99)
Potassium: 5.3 meq/L — ABNORMAL HIGH (ref 3.5–5.1)
Sodium: 143 meq/L (ref 135–145)
Total Bilirubin: 0.7 mg/dL (ref 0.2–1.2)
Total Protein: 7.6 g/dL (ref 6.0–8.3)

## 2023-03-07 LAB — TSH: TSH: 1.61 u[IU]/mL (ref 0.35–5.50)

## 2023-03-07 LAB — T4, FREE: Free T4: 0.69 ng/dL (ref 0.60–1.60)

## 2023-03-07 LAB — VITAMIN B12: Vitamin B-12: 238 pg/mL (ref 211–911)

## 2023-03-07 LAB — FERRITIN: Ferritin: 129.6 ng/mL (ref 10.0–291.0)

## 2023-03-08 ENCOUNTER — Other Ambulatory Visit: Payer: Self-pay | Admitting: Family Medicine

## 2023-03-08 ENCOUNTER — Telehealth: Payer: Self-pay | Admitting: Family Medicine

## 2023-03-08 DIAGNOSIS — E875 Hyperkalemia: Secondary | ICD-10-CM

## 2023-03-08 NOTE — Telephone Encounter (Signed)
Please add potassium labs and pt is also requesting to recheck cholesterol at visit as well since she has been taking medication. Lab appt made for monday

## 2023-03-08 NOTE — Progress Notes (Signed)
Her potassium is mildly elevated and I would like to recheck this one day next week. A lab visit is fine. Ask her to come in well hydrated. If she is taking anything with potassium in it over the counter, then stop. Also, is she taking her cholesterol medication everyday? Her cholesterol is not in goal range so we may need to increase the dose if she is taking it daily. Her labs are fine o/w but I also recommend taking a Women's One A Day multivitamin or something similar.

## 2023-03-08 NOTE — Telephone Encounter (Signed)
Patient states that she is taking her cholesterol medication but she would like for you to recheck when you order the labs for the potassium.  Please call patient and advise or send a my chart message

## 2023-03-11 ENCOUNTER — Other Ambulatory Visit: Payer: Self-pay | Admitting: Family Medicine

## 2023-03-11 ENCOUNTER — Other Ambulatory Visit (INDEPENDENT_AMBULATORY_CARE_PROVIDER_SITE_OTHER): Payer: Medicare HMO

## 2023-03-11 DIAGNOSIS — E875 Hyperkalemia: Secondary | ICD-10-CM | POA: Diagnosis not present

## 2023-03-11 DIAGNOSIS — E7800 Pure hypercholesterolemia, unspecified: Secondary | ICD-10-CM

## 2023-03-11 LAB — BASIC METABOLIC PANEL
BUN: 22 mg/dL (ref 6–23)
CO2: 28 meq/L (ref 19–32)
Calcium: 9.3 mg/dL (ref 8.4–10.5)
Chloride: 101 meq/L (ref 96–112)
Creatinine, Ser: 0.59 mg/dL (ref 0.40–1.20)
GFR: 94.43 mL/min (ref >60.00)
Glucose, Bld: 92 mg/dL (ref 70–99)
Potassium: 4.6 meq/L (ref 3.5–5.1)
Sodium: 137 meq/L (ref 135–145)

## 2023-03-11 MED ORDER — ATORVASTATIN CALCIUM 20 MG PO TABS: 20.0000 mg | ORAL_TABLET | Freq: Every day | ORAL | 1 refills | Status: DC

## 2023-03-15 ENCOUNTER — Other Ambulatory Visit: Payer: Self-pay | Admitting: Family Medicine

## 2023-03-15 DIAGNOSIS — Z1231 Encounter for screening mammogram for malignant neoplasm of breast: Secondary | ICD-10-CM

## 2023-04-05 ENCOUNTER — Ambulatory Visit (INDEPENDENT_AMBULATORY_CARE_PROVIDER_SITE_OTHER): Payer: Medicare Other | Admitting: Obstetrics and Gynecology

## 2023-04-05 ENCOUNTER — Encounter: Payer: Self-pay | Admitting: Obstetrics and Gynecology

## 2023-04-05 ENCOUNTER — Other Ambulatory Visit (HOSPITAL_COMMUNITY)
Admission: RE | Admit: 2023-04-05 | Discharge: 2023-04-05 | Disposition: A | Discharge status: Home / Self Care | Payer: Medicare Other | Source: Ambulatory Visit | Attending: Obstetrics and Gynecology | Admitting: Obstetrics and Gynecology

## 2023-04-05 VITALS — BP 128/84 | HR 82 | Ht 64.5 in | Wt 146.0 lb

## 2023-04-05 DIAGNOSIS — N898 Other specified noninflammatory disorders of vagina: Secondary | ICD-10-CM

## 2023-04-05 DIAGNOSIS — Z1151 Encounter for screening for human papillomavirus (HPV): Secondary | ICD-10-CM | POA: Insufficient documentation

## 2023-04-05 DIAGNOSIS — D229 Melanocytic nevi, unspecified: Secondary | ICD-10-CM | POA: Diagnosis not present

## 2023-04-05 DIAGNOSIS — E2839 Other primary ovarian failure: Secondary | ICD-10-CM

## 2023-04-05 DIAGNOSIS — Z01419 Encounter for gynecological examination (general) (routine) without abnormal findings: Secondary | ICD-10-CM | POA: Insufficient documentation

## 2023-04-05 NOTE — Patient Instructions (Signed)
 Bone Health Bones protect organs, store calcium, anchor muscles, and support the whole body. Keeping your bones strong is important, especially as you get older. You can take actions to help keep your bones strong and healthy. Why is keeping my bones healthy important?  Keeping your bones healthy is important because your body constantly replaces bone cells. Cells get old, and new cells take their place. As we age, we lose bone cells because the body may not be able to make enough new cells to replace the old cells. The amount of bone cells and bone tissue you have is referred to as bone mass. The higher your bone mass, the stronger your bones. The aging process leads to an overall loss of bone mass in the body, which can increase the likelihood of: Broken bones. A condition in which the bones become weak and brittle (osteoporosis). A large decline in bone mass occurs in older adults. In women, it occurs about the time of menopause. What actions can I take to keep my bones healthy? Good health habits are important for maintaining healthy bones. This includes eating nutritious foods and exercising regularly. To have healthy bones, you need to get enough of the right minerals and vitamins. Most nutrition experts recommend getting these nutrients from the foods that you eat. In some cases, taking supplements may also be recommended. Doing certain types of exercise is also important for bone health. What are the nutritional recommendations for healthy bones?  Eating a well-balanced diet with plenty of calcium and vitamin D will help to protect your bones. Nutritional recommendations vary from person to person. Ask your health care provider what is healthy for you. Here are some general guidelines. Get enough calcium Calcium is the most important (essential) mineral for bone health. Most people can get enough calcium from their diet, but supplements may be recommended for people who are at risk for  osteoporosis. Good sources of calcium include: Dairy products, such as low-fat or nonfat milk, cheese, and yogurt. Dark green leafy vegetables, such as bok choy and broccoli. Foods that have calcium added to them (are fortified). Foods that may be fortified with calcium include orange juice, cereal, bread, soy beverages, and tofu products. Nuts, such as almonds. Follow these recommended amounts for daily calcium intake: Infants, 0-6 months: 200 mg. Infants, 6-12 months: 260 mg. Children, age 647-3: 700 mg. Children, age 64-8: 1,000 mg. Children, age 642-13: 1,300 mg. Teens, age 38-18: 1,300 mg. Adults, age 66-50: 1,000 mg. Adults, age 23-70: Men: 1,000 mg. Women: 1,200 mg. Adults, age 97 or older: 1,200 mg. Pregnant and breastfeeding females: Teens: 1,300 mg. Adults: 1,000 mg. Get enough vitamin D Vitamin D is the most essential vitamin for bone health. It helps the body absorb calcium. Sunlight stimulates the skin to make vitamin D, so be sure to get enough sunlight. If you live in a cold climate or you do not get outside often, your health care provider may recommend that you take vitamin D supplements. Good sources of vitamin D in your diet include: Egg yolks. Saltwater fish. Milk and cereal fortified with vitamin D. Follow these recommended amounts for daily vitamin D intake: Infants, 0-12 months: 400 international units (IU). Children and teens, age 647-18: 600 international units. Adults, age 31 or younger: 600 international units. Adults, age 9 or older: 600-1,000 international units. Get other important nutrients Other nutrients that are important for bone health include: Phosphorus. This mineral is found in meat, poultry, dairy foods, nuts, and legumes. The  recommended daily intake for adult men and adult women is 700 mg. Magnesium. This mineral is found in seeds, nuts, dark green vegetables, and legumes. The recommended daily intake for adult men is 400-420 mg. For adult women,  it is 310-320 mg. Vitamin K. This vitamin is found in green leafy vegetables. The recommended daily intake is 120 mcg for adult men and 90 mcg for adult women. What type of physical activity is best for building and maintaining healthy bones? Weight-bearing and strength-building activities are important for building and maintaining healthy bones. Weight-bearing activities cause muscles and bones to work against gravity. Strength-building activities increase the strength of the muscles that support bones. Weight-bearing and muscle-building activities include: Walking and hiking. Jogging and running. Dancing. Gym exercises. Lifting weights. Tennis and racquetball. Climbing stairs. Aerobics. Adults should get at least 30 minutes of moderate physical activity on most days. Children should get at least 60 minutes of moderate physical activity on most days. Ask your health care provider what type of exercise is best for you. How can I find out if my bone mass is low? Bone mass can be measured with an X-ray test called a bone mineral density (BMD) test. This test is recommended for all women who are age 19 or older. It may also be recommended for: Men who are age 55 or older. People who are at risk for osteoporosis because of: Having a long-term disease that weakens bones, such as kidney disease or rheumatoid arthritis. Having menopause earlier than normal. Taking medicine that weakens bones, such as steroids, thyroid hormones, or hormone treatment for breast cancer or prostate cancer. Smoking. Drinking three or more alcoholic drinks a day. Being underweight. Sedentary lifestyle. If you find that you have a low bone mass, you may be able to prevent osteoporosis or further bone loss by changing your diet and lifestyle. Where can I find more information? Bone Health & Osteoporosis Foundation: https://carlson-fletcher.info/ [employer]: www.bones.http://www.myers.net/ International Osteoporosis  Foundation: Investment banker, operational.iofbonehealth.org Summary The aging process leads to an overall loss of bone mass in the body, which can increase the likelihood of broken bones and osteoporosis. Eating a well-balanced diet with plenty of calcium and vitamin D will help to protect your bones. Weight-bearing and strength-building activities are also important for building and maintaining strong bones. Bone mass can be measured with an X-ray test called a bone mineral density (BMD) test. This information is not intended to replace advice given to you by your health care provider. Make sure you discuss any questions you have with your health care provider. Document Revised: 08/31/2020 Document Reviewed: 08/31/2020 Elsevier Patient Education  2024 [employer].

## 2023-04-05 NOTE — Progress Notes (Signed)
 68 y.o. y.o. female here for annual exam. No LMP recorded. Patient is postmenopausal.    Postmenopausal on no HRT with no bleeding.  Normal Pap and mammogram 2023, referral placed, history.   2017 normal DEXA normal, referral placed for repeat.  2017- colonoscopy. . Pap: no abnormal. Discussed she does not need further pap smears after this one Multiple nevus: referral to dermatology for body check  Body mass index is 24.67 kg/m.     03/05/2023    7:59 AM 10/30/2022    8:00 AM 03/30/2022    1:32 PM  Depression screen PHQ 2/9  Decreased Interest 1 0 0  Down, Depressed, Hopeless 3 0 0  PHQ - 2 Score 4 0 0  Altered sleeping 2 0 0  Tired, decreased energy 3 0 0  Change in appetite 0 0 0  Feeling bad or failure about yourself  2 0 0  Trouble concentrating 0 0 0  Moving slowly or fidgety/restless 0 0 0  Suicidal thoughts 0 0 0  PHQ-9 Score 11 0 0  Difficult doing work/chores  Not difficult at all Not difficult at all    Blood pressure 128/84, pulse 82, height 5' 4.5 (1.638 m), weight 146 lb (66.2 kg), SpO2 98%.  No results found for: DIAGPAP, HPVHIGH, ADEQPAP  GYN HISTORY: No results found for: DIAGPAP, HPVHIGH, ADEQPAP  OB History  Gravida Para Term Preterm AB Living  1    1 0  SAB IAB Ectopic Multiple Live Births  1        # Outcome Date GA Lbr Len/2nd Weight Sex Type Anes PTL Lv  1 SAB             Past Medical History:  Diagnosis Date   Arthritis    Depression    Hyperlipidemia    past hx of but not now    Smoker    1/2 pack per week-former smojker 06-15-15    Past Surgical History:  Procedure Laterality Date   COLONOSCOPY     last colon 17 years ago normal per pt   DENTAL SURGERY      Current Outpatient Medications on File Prior to Visit  Medication Sig Dispense Refill   atorvastatin  (LIPITOR) 20 MG tablet Take 1 tablet (20 mg total) by mouth daily. 90 tablet 1   bimatoprost (LUMIGAN) 0.01 % SOLN 1 drop at bedtime.     chlorhexidine  (PERIDEX) 0.12 % solution      DULoxetine  (CYMBALTA ) 30 MG capsule TAKE 1 CAPSULE(30 MG) BY MOUTH DAILY 90 capsule 2   fluticasone  (FLONASE ) 50 MCG/ACT nasal spray SPRAY 2 SPRAYS INTO EACH NOSTRIL EVERY DAY 48 mL 1   meloxicam  (MOBIC ) 7.5 MG tablet TAKE 1 TABLET BY MOUTH EVERY DAY (Patient not taking: Reported on 04/05/2023) 90 tablet 3   No current facility-administered medications on file prior to visit.    Social History   Socioeconomic History   Marital status: Single    Spouse name: Not on file   Number of children: Not on file   Years of education: Not on file   Highest education level: Not on file  Occupational History   Not on file  Tobacco Use   Smoking status: Former    Current packs/day: 0.00    Types: Cigarettes    Quit date: 2008    Years since quitting: 17.0   Smokeless tobacco: Never   Tobacco comments:    4 cig/day from age 66 until 2008.  Vaping  Use   Vaping status: Never Used  Substance and Sexual Activity   Alcohol use: Yes    Alcohol/week: 6.0 standard drinks of alcohol    Types: 6 Glasses of wine per week    Comment: every other day   Drug use: No   Sexual activity: Not Currently    Comment: 1st intercourse 70 yo-5 partners  Other Topics Concern   Not on file  Social History Narrative   Are you right handed or left handed? right   Are you currently employed ? 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 yes   Who lives with you?    What type of home do you live in: 1 story or 2 story? two   Caffiene 1 - 2 cups    Social Drivers of Corporate Investment Banker Strain: Not on file  Food Insecurity: Not on file  Transportation Needs: Not on file  Physical Activity: Not on file  Stress: Not on file  Social Connections: Not on file  Intimate Partner Violence: Not on file    Family History  Problem Relation Age of Onset   Parkinsonism Mother 71       DIED W PARKINSON'S   Hypertension Father    Heart disease  Father    Colon polyps Father    Dementia Father    Diabetes Brother    Colon cancer Neg Hx    Esophageal cancer Neg Hx    Rectal cancer Neg Hx    Stomach cancer Neg Hx    Pancreatic cancer Neg Hx    Breast cancer Neg Hx      No Known Allergies    Patient's last menstrual period was No LMP recorded. Patient is postmenopausal..           Review of Systems Alls systems reviewed and are negative.     Physical Exam Constitutional:      Appearance: Normal appearance.  Genitourinary:     Vulva and urethral meatus normal.     No lesions in the vagina.     Right Labia: No rash, lesions or skin changes.    Left Labia: No lesions, skin changes or rash.    No vaginal discharge or tenderness.     No vaginal prolapse present.    No vaginal atrophy present.     Right Adnexa: not tender, not palpable and no mass present.    Left Adnexa: not tender, not palpable and no mass present.    No cervical motion tenderness or discharge.     Uterus is not enlarged, tender or irregular.  Breasts:    Right: Normal.     Left: Normal.  HENT:     Head: Normocephalic.  Neck:     Thyroid : No thyroid  mass, thyromegaly or thyroid  tenderness.  Cardiovascular:     Rate and Rhythm: Normal rate and regular rhythm.     Heart sounds: Normal heart sounds, S1 normal and S2 normal.  Pulmonary:     Effort: Pulmonary effort is normal.     Breath sounds: Normal breath sounds and air entry.  Abdominal:     General: There is no distension.     Palpations: Abdomen is soft. There is no mass.     Tenderness: There is no abdominal tenderness. There is no guarding or rebound.  Musculoskeletal:        General: Normal range of motion.     Cervical back:  Full passive range of motion without pain, normal range of motion and neck supple. No tenderness.     Right lower leg: No edema.     Left lower leg: No edema.  Neurological:     Mental Status: She is alert.  Skin:    General: Skin is warm.  Psychiatric:         Mood and Affect: Mood normal.        Behavior: Behavior normal.        Thought Content: Thought content normal.  Vitals and nursing note reviewed. Exam conducted with a chaperone present.       A:         [facility] exam                             P:        Pap smear collected today can stop after this. No prior abnormal Encouraged annual mammogram screening Colon cancer screening up-to-date DXA ordered today Labs and immunizations to do with PMD Discussed breast self exams Encouraged healthy lifestyle practices Encouraged Vit D and Calcium    No follow-ups on file.  Monica Hood

## 2023-04-06 LAB — SURESWAB® ADVANCED VAGINITIS PLUS,TMA
C. trachomatis RNA, TMA: NOT DETECTED
CANDIDA SPECIES: NOT DETECTED
Candida glabrata: NOT DETECTED
N. gonorrhoeae RNA, TMA: NOT DETECTED
SURESWAB(R) ADV BACTERIAL VAGINOSIS(BV),TMA: NEGATIVE
TRICHOMONAS VAGINALIS (TV),TMA: NOT DETECTED

## 2023-04-09 LAB — CYTOLOGY - PAP
Comment: NEGATIVE
Diagnosis: NEGATIVE
High risk HPV: NEGATIVE

## 2023-04-10 DIAGNOSIS — D225 Melanocytic nevi of trunk: Secondary | ICD-10-CM | POA: Diagnosis not present

## 2023-04-10 DIAGNOSIS — L821 Other seborrheic keratosis: Secondary | ICD-10-CM | POA: Diagnosis not present

## 2023-04-10 DIAGNOSIS — L300 Nummular dermatitis: Secondary | ICD-10-CM | POA: Diagnosis not present

## 2023-04-10 DIAGNOSIS — B351 Tinea unguium: Secondary | ICD-10-CM | POA: Diagnosis not present

## 2023-04-12 ENCOUNTER — Ambulatory Visit
Admission: RE | Admit: 2023-04-12 | Discharge: 2023-04-12 | Disposition: A | Discharge status: Home / Self Care | Payer: Medicare Other | Source: Ambulatory Visit | Attending: Family Medicine | Admitting: Family Medicine

## 2023-04-12 DIAGNOSIS — Z1231 Encounter for screening mammogram for malignant neoplasm of breast: Secondary | ICD-10-CM

## 2023-04-25 ENCOUNTER — Encounter: Payer: Self-pay | Admitting: Obstetrics and Gynecology

## 2023-04-25 ENCOUNTER — Ambulatory Visit (HOSPITAL_BASED_OUTPATIENT_CLINIC_OR_DEPARTMENT_OTHER)
Admission: RE | Admit: 2023-04-25 | Discharge: 2023-04-25 | Disposition: A | Discharge status: Home / Self Care | Payer: Medicare Other | Source: Ambulatory Visit | Attending: Obstetrics and Gynecology | Admitting: Obstetrics and Gynecology

## 2023-04-25 DIAGNOSIS — Z13820 Encounter for screening for osteoporosis: Secondary | ICD-10-CM | POA: Insufficient documentation

## 2023-04-25 DIAGNOSIS — M85851 Other specified disorders of bone density and structure, right thigh: Secondary | ICD-10-CM | POA: Insufficient documentation

## 2023-04-25 DIAGNOSIS — Z780 Asymptomatic menopausal state: Secondary | ICD-10-CM | POA: Insufficient documentation

## 2023-04-25 DIAGNOSIS — Z01419 Encounter for gynecological examination (general) (routine) without abnormal findings: Secondary | ICD-10-CM

## 2023-05-01 ENCOUNTER — Encounter: Payer: Self-pay | Admitting: Obstetrics and Gynecology

## 2023-05-14 DIAGNOSIS — H401111 Primary open-angle glaucoma, right eye, mild stage: Secondary | ICD-10-CM | POA: Diagnosis not present

## 2023-06-05 DIAGNOSIS — L821 Other seborrheic keratosis: Secondary | ICD-10-CM | POA: Diagnosis not present

## 2023-06-28 ENCOUNTER — Encounter: Payer: Self-pay | Admitting: Family Medicine

## 2023-06-28 ENCOUNTER — Ambulatory Visit: Payer: Medicare HMO | Admitting: Family Medicine

## 2023-06-28 VITALS — BP 120/84 | HR 73 | Temp 97.6°F | Ht 64.5 in | Wt 149.0 lb

## 2023-06-28 DIAGNOSIS — M170 Bilateral primary osteoarthritis of knee: Secondary | ICD-10-CM | POA: Diagnosis not present

## 2023-06-28 DIAGNOSIS — M8580 Other specified disorders of bone density and structure, unspecified site: Secondary | ICD-10-CM

## 2023-06-28 DIAGNOSIS — E7800 Pure hypercholesterolemia, unspecified: Secondary | ICD-10-CM

## 2023-06-28 DIAGNOSIS — F3341 Major depressive disorder, recurrent, in partial remission: Secondary | ICD-10-CM | POA: Diagnosis not present

## 2023-06-28 NOTE — Progress Notes (Signed)
 Subjective:     Patient ID: Monica Hood, female    DOB: December 14, 1955, 68 y.o.   MRN: [id number]  Chief Complaint  Patient presents with   Medical Management of Chronic Issues    6 months f/u,     HPI   History of Present Illness         Other providers:  OB/GYN  Eyes- Dr. Burundi (?glaucoma) and cataracts Neurology- Dr. Loleta Chance  [facility] for knees   Here to follow up on chronic health conditions   Exercises regularly  Taking vitamin D and calcium   DEXA in January 2025 shows osteopenia with a T-score -1.7.  She is currently taking a vitamin D supplement.  She is also taking calcium and has recently started exercising.  Chronic knee pain.  She stopped taking meloxicam and is now using Voltaren gel and Tylenol arthritis.  She is still having pain.  She has an appointment with [facility]. No new injury    Health Maintenance Due  Topic Date Due   Medicare Annual Wellness (AWV)  Never done   COVID-19 Vaccine (1) Never done    Past Medical History:  Diagnosis Date   Arthritis    Depression    Hyperlipidemia    past hx of but not now    Smoker    1/2 pack per week-former smojker 06-15-15    Past Surgical History:  Procedure Laterality Date   COLONOSCOPY     last colon 17 years ago normal per pt   DENTAL SURGERY      Family History  Problem Relation Age of Onset   Parkinsonism Mother 71       DIED W [facility]   Hypertension Father    Heart disease Father    Colon polyps Father    Dementia Father    Diabetes Brother    Colon cancer Neg Hx    Esophageal cancer Neg Hx    Rectal cancer Neg Hx    Stomach cancer Neg Hx    Pancreatic cancer Neg Hx    Breast cancer Neg Hx     Social History   Socioeconomic History   Marital status: Single    Spouse name: Not on file   Number of children: Not on file   Years of education: Not on file   Highest education level: Not on file  Occupational History   Not on file  Tobacco Use   Smoking status: Former     Current packs/day: 0.00    Types: Cigarettes    Quit date: 2008    Years since quitting: 17.2   Smokeless tobacco: Never   Tobacco comments:    4 cig/day from age 80 until 2008.  Vaping Use   Vaping status: Never Used  Substance and Sexual Activity   Alcohol use: Yes    Alcohol/week: 6.0 standard drinks of alcohol    Types: 6 Glasses of wine per week    Comment: every other day   Drug use: No   Sexual activity: Not Currently    Comment: 1st intercourse 81 yo-5 partners  Other Topics Concern   Not on file  Social History Narrative   Are you right handed or left handed? right   Are you currently employed ? 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 yes   Who lives with you?    What type of home do you live in: 1 story or 2  story? two   Caffiene 1 - 2 cups    Social Drivers of Corporate investment banker Strain: Not on file  Food Insecurity: Not on file  Transportation Needs: Not on file  Physical Activity: Not on file  Stress: Not on file  Social Connections: Not on file  Intimate Partner Violence: Not on file    Outpatient Medications Prior to Visit  Medication Sig Dispense Refill   atorvastatin (LIPITOR) 20 MG tablet Take 1 tablet (20 mg total) by mouth daily. 90 tablet 1   bimatoprost (LUMIGAN) 0.01 % SOLN 1 drop at bedtime.     chlorhexidine (PERIDEX) 0.12 % solution      DULoxetine (CYMBALTA) 30 MG capsule TAKE 1 CAPSULE(30 MG) BY MOUTH DAILY 90 capsule 2   fluticasone (FLONASE) 50 MCG/ACT nasal spray SPRAY 2 SPRAYS INTO EACH NOSTRIL EVERY DAY 48 mL 1   meloxicam (MOBIC) 7.5 MG tablet TAKE 1 TABLET BY MOUTH EVERY DAY 90 tablet 3   No facility-administered medications prior to visit.    No Known Allergies  Review of Systems  Constitutional:  Negative for chills, fever and malaise/fatigue.  Respiratory:  Negative for shortness of breath.   Cardiovascular:  Negative for chest pain, palpitations and leg swelling.   Gastrointestinal:  Negative for abdominal pain, constipation, diarrhea, nausea and vomiting.  Genitourinary:  Negative for dysuria, frequency and urgency.  Musculoskeletal:  Positive for joint pain.  Neurological:  Negative for dizziness, focal weakness and headaches.  Psychiatric/Behavioral:  Negative for depression. The patient is not nervous/anxious.        Objective:    Physical Exam Constitutional:      General: She is not in acute distress.    Appearance: She is not ill-appearing.  Eyes:     Extraocular Movements: Extraocular movements intact.     Conjunctiva/sclera: Conjunctivae normal.  Cardiovascular:     Rate and Rhythm: Normal rate.  Pulmonary:     Effort: Pulmonary effort is normal.  Musculoskeletal:     Cervical back: Normal range of motion and neck supple.  Skin:    General: Skin is warm and dry.  Neurological:     General: No focal deficit present.     Mental Status: She is alert and oriented to person, place, and time.  Psychiatric:        Mood and Affect: Mood normal.        Behavior: Behavior normal.        Thought Content: Thought content normal.      BP 120/84 (BP Location: Left Arm, Patient Position: Sitting)   Pulse 73   Temp 97.6 F (36.4 C)   Ht 5' 4.5" (1.638 m)   Wt 149 lb (67.6 kg)   SpO2 96%   BMI 25.18 kg/m  Wt Readings from Last 3 Encounters:  06/28/23 149 lb (67.6 kg)  04/05/23 146 lb (66.2 kg)  03/05/23 149 lb (67.6 kg)       Assessment & Plan:   Problem List Items Addressed This Visit     Hyperlipidemia   Major depressive disorder, recurrent, in partial remission (HCC)   Osteoarthritis of both knees - Primary   Other Visit Diagnoses       Osteopenia determined by x-ray          Reviewed labs from her previous visit, reviewed medications and discussed chronic knee pain.  She has an appointment to see [facility] for knee pain.  We discussed continuing Voltaren gel, Tylenol arthritis and taking ibuprofen  as needed as  opposed to take meloxicam daily for now.  Discussed osteopenia.  She will continue calcium and vitamin D as well as weightbearing exercises.  Her mood is good. She will schedule an AWV with the nurse and see me back in August when she is due for her next CPE.  I am having Elfa L. Mccutchen maintain her chlorhexidine, meloxicam, DULoxetine, fluticasone, Lumigan, and atorvastatin.  No orders of the defined types were placed in this encounter.

## 2023-07-19 DIAGNOSIS — M170 Bilateral primary osteoarthritis of knee: Secondary | ICD-10-CM | POA: Diagnosis not present

## 2023-08-13 DIAGNOSIS — H401111 Primary open-angle glaucoma, right eye, mild stage: Secondary | ICD-10-CM | POA: Diagnosis not present

## 2023-09-05 DIAGNOSIS — M170 Bilateral primary osteoarthritis of knee: Secondary | ICD-10-CM | POA: Diagnosis not present

## 2023-09-12 DIAGNOSIS — M170 Bilateral primary osteoarthritis of knee: Secondary | ICD-10-CM | POA: Diagnosis not present

## 2023-09-19 DIAGNOSIS — M170 Bilateral primary osteoarthritis of knee: Secondary | ICD-10-CM | POA: Diagnosis not present

## 2023-09-30 ENCOUNTER — Other Ambulatory Visit: Payer: Self-pay | Admitting: Family Medicine

## 2023-09-30 DIAGNOSIS — E7800 Pure hypercholesterolemia, unspecified: Secondary | ICD-10-CM

## 2023-09-30 MED ORDER — ATORVASTATIN CALCIUM 20 MG PO TABS: 20.0000 mg | ORAL_TABLET | Freq: Every day | ORAL | 1 refills | Status: AC

## 2023-09-30 NOTE — Telephone Encounter (Signed)
 Copied from CRM [id number]. Topic: Clinical - Medication Refill >> Sep 30, 2023  9:54 AM Henretta I wrote: Medication: atorvastatin  (LIPITOR) 20 MG tablet  Has the patient contacted their pharmacy? Yes, pharm stated it was denied  (Agent: If no, request that the patient contact the pharmacy for the refill. If patient does not wish to contact the pharmacy document the reason why and proceed with request.) (Agent: If yes, when and what did the pharmacy advise?)  This is the patient's preferred pharmacy:   [id number] [place], [place] - [address] [place] [address] Phone: [phone] Fax: [phone]  Is this the correct pharmacy for this prescription? Yes If no, delete pharmacy and type the correct one.   Has the prescription been filled recently? No  Is the patient out of the medication? No, patient only has 1 left   Has the patient been seen for an appointment in the last year OR does the patient have an upcoming appointment? Yes  Can we respond through MyChart? Yes  Agent: Please be advised that Rx refills may take up to 3 business days. We ask that you follow-up with your pharmacy.

## 2023-10-01 ENCOUNTER — Telehealth: Payer: Self-pay | Admitting: Family Medicine

## 2023-10-01 NOTE — Telephone Encounter (Signed)
 Copied from CRM [id number]. Topic: Clinical - Medication Question >> Oct 01, 2023  9:49 AM Deaijah H wrote: Reason for CRM: Patient called in would like to know if her medication atorvastatin  (LIPITOR) 20 MG tablet is supposed to be changed from 10mg  to 20mg  before picking up from pharmacy. Please call [phone]

## 2023-10-01 NOTE — Telephone Encounter (Signed)
 LM for pt she needs to continue on the 20 mg and we can recheck at next visit if she comes in fasting. Provided office number for any questions.

## 2023-10-17 ENCOUNTER — Telehealth: Payer: Self-pay

## 2023-10-17 NOTE — Telephone Encounter (Signed)
 Copied from CRM [id number]. Topic: General - Other >> Oct 17, 2023  9:40 AM Martinique E wrote: Reason for CRM: Patient called in questioning if her PCP would still like to see her tomorrow morning for hip pain. Patient is stating that the pain is not getting any worse and that she has been doing some strengthening and stretching to help. Callback number [phone].

## 2023-10-17 NOTE — Telephone Encounter (Signed)
 Called pt and cancelled appt for tomorrow

## 2023-10-18 ENCOUNTER — Ambulatory Visit: Admitting: Family Medicine

## 2023-10-22 ENCOUNTER — Ambulatory Visit

## 2023-10-25 ENCOUNTER — Ambulatory Visit

## 2023-10-25 VITALS — Ht 64.5 in | Wt 150.0 lb

## 2023-10-25 DIAGNOSIS — Z0000 Encounter for general adult medical examination without abnormal findings: Secondary | ICD-10-CM

## 2023-10-25 NOTE — Patient Instructions (Addendum)
 Monica Hood , Thank you for taking time out of your busy schedule to complete your Annual Wellness Visit with me. I enjoyed our conversation and look forward to speaking with you again next year. I, as well as your care team,  appreciate your ongoing commitment to your health goals. Please review the following plan we discussed and let me know if I can assist you in the future. Your Game plan/ To Do List     Follow up Visits: Next Medicare AWV with our clinical staff: 10/30/2024   Have you seen your provider in the last 6 months (3 months if uncontrolled diabetes)? No Next Office Visit with your provider: 12/13/2023  Clinician Recommendations:  Aim for 30 minutes of exercise or brisk walking, 6-8 glasses of water, and 5 servings of fruits and vegetables each day.       This is a list of the screening recommended for you and due dates:  Health Maintenance  Topic Date Due   COVID-19 Vaccine (1) Never done   Flu Shot  11/01/2023   Medicare Annual Wellness Visit  10/24/2024   Mammogram  04/11/2025   Colon Cancer Screening  06/30/2025   DTaP/Tdap/Td vaccine (4 - Td or Tdap) 09/02/2031   Pneumococcal Vaccine for age over 72  Completed   DEXA scan (bone density measurement)  Completed   Hepatitis C Screening  Completed   Zoster (Shingles) Vaccine  Completed   Hepatitis B Vaccine  Aged Out   HPV Vaccine  Aged Out   Meningitis B Vaccine  Aged Out    Advanced directives: (Copy Requested) Please bring a copy of your health care power of attorney and living will to the office to be added to your chart at your convenience. You can mail to [facility] [address] [place], [place] [address] or email to [email or web address] Advance Care Planning is important because it:  [x]  Makes sure you receive the medical care that is consistent with your values, goals, and preferences  [x]  It provides guidance to your family and loved ones and reduces their decisional burden about  whether or not they are making the right decisions based on your wishes.  Follow the link provided in your after visit summary or read over the paperwork we have mailed to you to help you started getting your Advance Directives in place. If you need assistance in completing these, please reach out to us  so that we can help you!

## 2023-10-25 NOTE — Progress Notes (Addendum)
 Subjective:  Please attest and cosign this visit due to patients primary care provider not being in the office at the time the visit was completed.  (Pt of Monica Mackintosh, NP)   [name] is a 68 y.o. who presents for a Medicare Wellness preventive visit.  As a reminder, Annual Wellness Visits don't include a physical exam, and some assessments may be limited, especially if this visit is performed virtually. We may recommend an in-person follow-up visit with your provider if needed.  Visit Complete: Virtual I connected with  Monica Hood on 10/25/23 by a audio enabled telemedicine application and verified that I am speaking with the correct person using two identifiers.  Patient Location: Home  Provider Location: Office/Clinic  I discussed the limitations of evaluation and management by telemedicine. The patient expressed understanding and agreed to proceed.  Vital Signs: Because this visit was a virtual/telehealth visit, some criteria may be missing or patient reported. Any vitals not documented were not able to be obtained and vitals that have been documented are patient reported.  VideoDeclined- This patient declined Librarian, academic. Therefore the visit was completed with audio only.  Persons Participating in Visit: Patient.  AWV Questionnaire: No: Patient Medicare AWV questionnaire was not completed prior to this visit.  Cardiac Risk Factors include: advanced age (>72men, >48 women);dyslipidemia     Objective:    Today's Vitals   10/25/23 0848  Weight: 150 lb (68 kg)  Height: 5' 4.5 (1.638 m)   Body mass index is 25.35 kg/m.     10/25/2023    8:49 AM 05/16/2022    8:00 AM 06/15/2015    9:03 AM  Advanced Directives  Does Patient Have a Medical Advance Directive? Yes Yes Yes   Type of Estate agent of [place];Living will  Living will   Copy of Healthcare Power of Attorney in Chart? No - copy requested        Data saved with a previous flowsheet row definition    Current Medications (verified) Outpatient Encounter Medications as of 10/25/2023  Medication Sig   atorvastatin  (LIPITOR) 20 MG tablet Take 1 tablet (20 mg total) by mouth daily.   bimatoprost (LUMIGAN) 0.01 % SOLN 1 drop at bedtime.   chlorhexidine (PERIDEX) 0.12 % solution    DULoxetine  (CYMBALTA ) 30 MG capsule TAKE 1 CAPSULE(30 MG) BY MOUTH DAILY   fluticasone  (FLONASE ) 50 MCG/ACT nasal spray SPRAY 2 SPRAYS INTO EACH NOSTRIL EVERY DAY   meloxicam  (MOBIC ) 7.5 MG tablet TAKE 1 TABLET BY MOUTH EVERY DAY   No facility-administered encounter medications on file as of 10/25/2023.    Allergies (verified) Patient has no known allergies.   History: Past Medical History:  Diagnosis Date   Arthritis    Depression    Hyperlipidemia    past hx of but not now    Smoker    1/2 pack per week-former smojker 06-15-15   Past Surgical History:  Procedure Laterality Date   COLONOSCOPY     last colon 17 years ago normal per pt   DENTAL SURGERY     Family History  Problem Relation Age of Onset   Parkinsonism Mother 12       DIED W [facility]SON'S   Hypertension Father    Heart disease Father    Colon polyps Father    Dementia Father    Diabetes Brother    Colon cancer Neg Hx    Esophageal cancer Neg Hx    Rectal  cancer Neg Hx    Stomach cancer Neg Hx    Pancreatic cancer Neg Hx    Breast cancer Neg Hx    Social History   Socioeconomic History   Marital status: Single    Spouse name: Not on file   Number of children: Not on file   Years of education: Not on file   Highest education level: Not on file  Occupational History   Not on file  Tobacco Use   Smoking status: Former    Current packs/day: 0.00    Types: Cigarettes    Quit date: 2008    Years since quitting: 17.5   Smokeless tobacco: Never   Tobacco comments:    4 cig/day from age 13 until 2008.  Vaping Use   Vaping status: Never Used  Substance and Sexual  Activity   Alcohol use: Yes    Alcohol/week: 6.0 standard drinks of alcohol    Types: 6 Glasses of wine per week    Comment: every other day   Drug use: No   Sexual activity: Yes    Comment: 1st intercourse 60 yo-5 partners  Other Topics Concern   Not on file  Social History Narrative   Are you right handed or left handed? right   Are you currently employed ? 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 yes   Who lives with you?    What type of home do you live in: 1 story or 2 story? two   Caffiene 1 - 2 cups    Social Drivers of Corporate investment banker Strain: Low Risk  (10/25/2023)   Overall Financial Resource Strain (CARDIA)    Difficulty of Paying Living Expenses: Not hard at all  Food Insecurity: No Food Insecurity (10/25/2023)   Hunger Vital Sign    Worried About Running Out of Food in the Last Year: Never true    Ran Out of Food in the Last Year: Never true  Transportation Needs: No Transportation Needs (10/25/2023)   PRAPARE - Administrator, Civil Service (Medical): No    Lack of Transportation (Non-Medical): No  Physical Activity: Insufficiently Active (10/25/2023)   Exercise Vital Sign    Days of Exercise per Week: 3 days    Minutes of Exercise per Session: 20 min  Stress: No Stress Concern Present (10/25/2023)   [employer] of Occupational Health - Occupational Stress Questionnaire    Feeling of Stress: Only a little  Social Connections: Socially Isolated (10/25/2023)   Social Connection and Isolation Panel    Frequency of Communication with Friends and Family: Three times a week    Frequency of Social Gatherings with Friends and Family: Once a week    Attends Religious Services: Never    Database administrator or Organizations: No    Attends Banker Meetings: Never    Marital Status: Never married    Tobacco Counseling Counseling given: No Tobacco comments: 4 cig/day from age 15 until  2008.    Clinical Intake:  Pre-visit preparation completed: Yes  Pain : No/denies pain     BMI - recorded: 25.35 Nutritional Status: BMI 25 -29 Overweight Nutritional Risks: None Diabetes: No  Lab Results  Component Value Date   HGBA1C 5.4 09/01/2021     How often do you need to have someone help you when you read instructions, pamphlets, or other written materials from your doctor or pharmacy?: 1 -  Never  Interpreter Needed?: No  Information entered by :: Verdie Saba, CMA   Activities of Daily Living     10/25/2023    8:52 AM  In your present state of health, do you have any difficulty performing the following activities:  Hearing? 0  Vision? 0  Difficulty concentrating or making decisions? 0  Walking or climbing stairs? 0  Dressing or bathing? 0  Doing errands, shopping? 0  Preparing Food and eating ? N  Using the Toilet? N  In the past six months, have you accidently leaked urine? N  Do you have problems with loss of bowel control? N  Managing your Medications? N  Managing your Finances? N  Housekeeping or managing your Housekeeping? N    Patient Care Team: Lendia Monica CROME, NP-C as PCP - General (Family Medicine) Burundi, Heather, OD (Optometry)  I have updated your Care Teams any recent Medical Services you may have received from other providers in the past year.     Assessment:   This is a routine wellness examination for [place].  Hearing/Vision screen Hearing Screening - Comments:: Denies hearing difficulties   Vision Screening - Comments:: Wears rx glasses and contact lenses - up to date with routine eye exams with Heather Burundi of [facility]   Goals Addressed               This Visit's Progress     Patient Stated (pt-stated)        Patient stated she plans to restart exercising - be more consistent       Depression Screen     10/25/2023    8:53 AM 06/28/2023    8:13 AM 03/05/2023    7:59 AM 10/30/2022    8:00 AM 03/30/2022    1:32  PM 09/01/2021    8:10 AM 08/24/2020   10:47 PM  PHQ 2/9 Scores  PHQ - 2 Score 0 0 4 0 0 2 0  PHQ- 9 Score 1  11 0 0 5 0    Fall Risk     10/25/2023    8:52 AM 06/28/2023    8:12 AM 03/05/2023    8:03 AM 05/16/2022    8:00 AM 03/30/2022    1:32 PM  Fall Risk   Falls in the past year? 0 0 0 0 0  Number falls in past yr: 0 0 0 0   Injury with Fall? 0 0 0 0 0  Risk for fall due to : No Fall Risks No Fall Risks No Fall Risks  No Fall Risks  Follow up Falls evaluation completed;Falls prevention discussed Falls evaluation completed Falls evaluation completed Falls evaluation completed Falls evaluation completed      Data saved with a previous flowsheet row definition    MEDICARE RISK AT HOME:  Medicare Risk at Home Any stairs in or around the home?: Yes If so, are there any without handrails?: No Home free of loose throw rugs in walkways, pet beds, electrical cords, etc?: Yes Adequate lighting in your home to reduce risk of falls?: Yes Life alert?: No Use of a cane, walker or w/c?: No Grab bars in the bathroom?: Yes Shower chair or bench in shower?: No Elevated toilet seat or a handicapped toilet?: No  TIMED UP AND GO:  Was the test performed?  No  Cognitive Function: 6CIT completed        10/25/2023    9:01 AM  6CIT Screen  What Year? 0 points  What month?  0 points  What time? 0 points  Count back from 20 0 points  Months in reverse 0 points  Repeat phrase 0 points  Total Score 0 points    Immunizations Immunization History  Administered Date(s) Administered   Fluad Trivalent(High Dose 65+) 03/05/2023   Influenza Whole 04/11/2009   Influenza, Quadrivalent, Recombinant, Inj, Pf 03/13/2018   Influenza,inj,Quad PF,6+ Mos 12/18/2013   Influenza,inj,Quad PF,6-35 Mos 01/29/2019   PNEUMOCOCCAL CONJUGATE-20 10/30/2022   PPD Test 12/13/2011, 12/23/2014   Td 04/02/2005   Tdap 04/05/2011, 09/01/2021   Zoster Recombinant(Shingrix) 05/22/2018, 11/06/2018    Screening  Tests Health Maintenance  Topic Date Due   COVID-19 Vaccine (1) Never done   INFLUENZA VACCINE  11/01/2023   Medicare Annual Wellness (AWV)  10/24/2024   MAMMOGRAM  04/11/2025   Colonoscopy  06/30/2025   DTaP/Tdap/Td (4 - Td or Tdap) 09/02/2031   Pneumococcal Vaccine: 50+ Years  Completed   DEXA SCAN  Completed   Hepatitis C Screening  Completed   Zoster Vaccines- Shingrix  Completed   Hepatitis B Vaccines  Aged Out   HPV VACCINES  Aged Out   Meningococcal B Vaccine  Aged Out    Health Maintenance  Health Maintenance Due  Topic Date Due   COVID-19 Vaccine (1) Never done   Health Maintenance Items Addressed:10/25/2023  Additional Screening:  Vision Screening: Recommended annual ophthalmology exams for early detection of glaucoma and other disorders of the eye. Would you like a referral to an eye doctor? No  Patient stated she has had an eye exam w/Heather Burundi in 2025.  Dental Screening: Recommended annual dental exams for proper oral hygiene  Community Resource Referral / Chronic Care Management: CRR required this visit?  No   CCM required this visit?  No   Plan:    I have personally reviewed and noted the following in the patient's chart:   Medical and social history Use of alcohol, tobacco or illicit drugs  Current medications and supplements including opioid prescriptions. Patient is not currently taking opioid prescriptions. Functional ability and status Nutritional status Physical activity Advanced directives List of other physicians Hospitalizations, surgeries, and ER visits in previous 12 months Vitals Screenings to include cognitive, depression, and falls Referrals and appointments  In addition, I have reviewed and discussed with patient certain preventive protocols, quality metrics, and best practice recommendations. A written personalized care plan for preventive services as well as general preventive health recommendations were provided to  patient.   Verdie CHRISTELLA Saba, CMA   10/25/2023   After Visit Summary: (MyChart) Due to this being a telephonic visit, the after visit summary with patients personalized plan was offered to patient via MyChart   Notes: Nothing significant to report at this time.

## 2023-10-26 ENCOUNTER — Other Ambulatory Visit: Payer: Self-pay | Admitting: Family Medicine

## 2023-10-26 DIAGNOSIS — F325 Major depressive disorder, single episode, in full remission: Secondary | ICD-10-CM

## 2023-11-19 DIAGNOSIS — H401111 Primary open-angle glaucoma, right eye, mild stage: Secondary | ICD-10-CM | POA: Diagnosis not present

## 2023-12-09 ENCOUNTER — Ambulatory Visit (INDEPENDENT_AMBULATORY_CARE_PROVIDER_SITE_OTHER): Admitting: Psychiatry

## 2023-12-09 DIAGNOSIS — F331 Major depressive disorder, recurrent, moderate: Secondary | ICD-10-CM

## 2023-12-09 NOTE — Progress Notes (Signed)
 [facility] Initial Adult Exam  Name: Monica Hood Date: 12/09/2023 MRN: [id number] DOB: 08/15/1955 PCP: Lendia Boby LITTIE, NP-C  Time spent: 60 minutes   Guardian/Payee:  patient    Paperwork requested:  No   Reason for Visit /Presenting Problem:  I've struggled with depression most of my life and it can come and go but doesn't seem to have triggers per patient report. Wants to change my thought patterns. Eventually admits she drinks alcohol too much and knows it is impacting her physical health in multiple ways. Doesn't want to quit.   Mental Status Exam:    Appearance:   Casual     Behavior:  Appropriate, Sharing, and Motivated  Motor:  Normal  Speech/Language:   Clear and Coherent  Affect:  Depressed  Mood:  depressed and but it comes and goes sometimes and not real bad at the moment  Thought process:  goal directed  Thought content:    Rumination  Sensory/Perceptual disturbances:    WNL  Orientation:  oriented to person, place, time/date, situation, day of week, month of year, year, and stated date of Sept. 8, 2025  Attention:  Good  Concentration:  Good  Memory:  WNL  Fund of knowledge:   Good  Insight:    Good  Judgment:   Good  Impulse Control:  Good   Reported Symptoms:  see symptoms noted above  Risk Assessment: Danger to Self:  No Self-injurious Behavior: No Danger to Others: No Duty to Warn:no Physical Aggression / Violence:No  Access to Firearms a concern: No  Gang Involvement:No  Patient / guardian was educated about steps to take if suicide or homicide risk level increases between visits: yes While future psychiatric events cannot be accurately predicted, the patient does not currently require acute inpatient psychiatric care and does not currently meet [place]  involuntary commitment criteria.  Substance Abuse History: Current substance abuse: Yes     Past Psychiatric History:   Previous psychological history is significant for  anxiety, depression, and abuses alcohol. Outpatient Providers:2019 saw a faith based counselor after after dad's death. History of Psych Hospitalization: No  Psychological Testing: n/a   Abuse History: Victim of No., n/a   Report needed: No. Victim of Neglect:No. Perpetrator of n/a  Witness / Exposure to Domestic Violence: no  Protective Services Involvement: No  Witness to [employer] Violence:  No   Family History:  Family History  Problem Relation Age of Onset   Parkinsonism Mother 79       DIED W [facility]   Hypertension Father    Heart disease Father    Colon polyps Father    Dementia Father    Diabetes Brother    Colon cancer Neg Hx    Esophageal cancer Neg Hx    Rectal cancer Neg Hx    Stomach cancer Neg Hx    Pancreatic cancer Neg Hx    Breast cancer Neg Hx     Living situation: the patient lives alone, never married  Sexual Orientation:  Straight  Relationship Status: single  Name of spouse / other:n/a             If a parent, number of children / ages:n/a  Support Systems; friends lives alone Good support system  Financial Stress:  No   Income/Employment/Disability: Employment  Financial planner: No   Educational History: Education: grad. [employer]  Religion/Sprituality/World View:   Christian, [place] [employer]  Any cultural differences that may affect / interfere with treatment:  not  applicable   Recreation/Hobbies: walking and other exercise, listens to music   Stressors:Health problems    Strengths:  Supportive Relationships, Family, Friends, Church, Spirituality, Hopefulness, Journalist, newspaper, and Able to Communicate Effectively  Barriers:  Me, due to I've never been good with goals, and don't want to stop alcohol abuse and drinking most days  Legal History: Pending legal issue / charges: The patient has no significant history of legal issues. History of legal issue / charges: none  Medical History/Surgical History:Patient  confirms info below. Past Medical History:  Diagnosis Date   Arthritis    Depression    Hyperlipidemia    past hx of but not now    Smoker    1/2 pack per week-former smojker 06-15-15    Past Surgical History:  Procedure Laterality Date   COLONOSCOPY     last colon 17 years ago normal per pt   DENTAL SURGERY      Medications: Current Outpatient Medications  Medication Sig Dispense Refill   atorvastatin  (LIPITOR) 20 MG tablet Take 1 tablet (20 mg total) by mouth daily. 90 tablet 1   bimatoprost (LUMIGAN) 0.01 % SOLN 1 drop at bedtime.     chlorhexidine (PERIDEX) 0.12 % solution      DULoxetine  (CYMBALTA ) 30 MG capsule TAKE 1 CAPSULE(30 MG) BY MOUTH DAILY 90 capsule 2   fluticasone  (FLONASE ) 50 MCG/ACT nasal spray SPRAY 2 SPRAYS INTO EACH NOSTRIL EVERY DAY 48 mL 1   meloxicam  (MOBIC ) 7.5 MG tablet TAKE 1 TABLET BY MOUTH EVERY DAY 90 tablet 3   No current facility-administered medications for this visit.    No Known Allergies --None  Diagnoses:    ICD-10-CM   1. Major depressive disorder, recurrent episode, moderate (HCC)  F33.1      Treatment goal plan of care: Worked with patient collaboratively on her treatment plan and she is in agreement with that.  Her goals will remain on treatment plan as she works with strategies in sessions and outside of sessions to meet her goals.  Patient is signing a copy of her printed treatment plan instead of signing online.  Entheses a copy for her to keep and a copy that remains here at the office.  Progress will be assessed each session and documented in the progress or plan sections of treatment note. 1.  Identify life conflicts and/or situations including from the past and the present, that support current symptomology of anxiety and depression. 2.  Develop behavioral and cognitive strategies to reduce or eliminate excessive anxiety, depression.  Identify, challenge, and replace negative self-talk with more positive, realistic, and  empowering self-talk. 3.  Patient will work on developing reality based, positive cognitive messages that can help her improve her mood and outlook while also working on Environmental consultant.   Plan of Care:   This is a first appointment for this patient for therapy and today we collaboratively completed her initial evaluation and initial treatment goal plan.  Ellana Kawa is a depressed 68 year old, never married female.  Some occasional anxiety but mostly depressed.  Has history of alcohol abuse  and not really wanting to quit but tells me today that she also has not ruled that out. Discussed her need to get off alcohol to really help herself and be able to at least try some cutting back which he agreed to today. Stated she doesn't drink during day due to her job and drinks wine or beer, 3 glasses a night. After talking at length, she agrees that  a good first step in cutting back on her alcohol is to go to 2 glasses at night. Does report several friends, who feel close. Attends her church regularly. Has 1 brother (younger) and 1 sister is 3 yrs older than patient. Has good relationship with both. Brother lives in [place] and sister is in [place] where we grew up.  Works 4 full days weekly, has been at this job 4 yrs, grad from [employer]. Studied art and wants to get back to doing something creative. Patient decided herself to come for help because  I read Psychology Today and knew that I can function but do need help.  Has additional family including nieces and nephews.  Had a few prior visits with a therapist in 2019 due to alcohol abuse. Still abusing alcohol and starting to realize it's impact. Wanting to not feel depressed and have more energy. Wants to feel more hope and be alive and enjoy life more. I can manage things but I want to do more than just manage.  Plan to pursue patient participating in AA to have more support in that area and will continue to see her in therapy.  Seems to  be motivated today and want to encourage that motivation with her.  Plans to have her meds evaluated here.  Please see note sections above for further information on this patient.  Continues to deny any thoughts of self-harm.  Patient is scheduling to return within 2 weeks and has some goal related homework that she will be working on between now and that appointment.  Initial treatment goal review and patient is in agreement.  To return for next appointment within 2 weeks.   Barnie Bunde, LCSW

## 2023-12-13 ENCOUNTER — Encounter: Payer: Self-pay | Admitting: Family Medicine

## 2023-12-13 ENCOUNTER — Ambulatory Visit: Admitting: Family Medicine

## 2023-12-13 VITALS — BP 112/80 | HR 80 | Temp 97.6°F | Ht 64.5 in | Wt 146.0 lb

## 2023-12-13 DIAGNOSIS — E7800 Pure hypercholesterolemia, unspecified: Secondary | ICD-10-CM

## 2023-12-13 DIAGNOSIS — D2230 Melanocytic nevi of unspecified part of face: Secondary | ICD-10-CM | POA: Diagnosis not present

## 2023-12-13 DIAGNOSIS — Z0001 Encounter for general adult medical examination with abnormal findings: Secondary | ICD-10-CM | POA: Diagnosis not present

## 2023-12-13 DIAGNOSIS — M8580 Other specified disorders of bone density and structure, unspecified site: Secondary | ICD-10-CM

## 2023-12-13 DIAGNOSIS — L819 Disorder of pigmentation, unspecified: Secondary | ICD-10-CM | POA: Diagnosis not present

## 2023-12-13 DIAGNOSIS — Z1329 Encounter for screening for other suspected endocrine disorder: Secondary | ICD-10-CM | POA: Diagnosis not present

## 2023-12-13 DIAGNOSIS — Z23 Encounter for immunization: Secondary | ICD-10-CM

## 2023-12-13 DIAGNOSIS — J309 Allergic rhinitis, unspecified: Secondary | ICD-10-CM

## 2023-12-13 DIAGNOSIS — M170 Bilateral primary osteoarthritis of knee: Secondary | ICD-10-CM | POA: Diagnosis not present

## 2023-12-13 DIAGNOSIS — E559 Vitamin D deficiency, unspecified: Secondary | ICD-10-CM | POA: Diagnosis not present

## 2023-12-13 DIAGNOSIS — Z833 Family history of diabetes mellitus: Secondary | ICD-10-CM | POA: Diagnosis not present

## 2023-12-13 LAB — LIPID PANEL
Cholesterol: 177 mg/dL (ref 0–200)
HDL: 60.4 mg/dL (ref >39.00)
LDL Cholesterol: 107 mg/dL — ABNORMAL HIGH (ref 0–99)
NonHDL: 116.97
Total CHOL/HDL Ratio: 3
Triglycerides: 48 mg/dL (ref 0.0–149.0)
VLDL: 9.6 mg/dL (ref 0.0–40.0)

## 2023-12-13 LAB — COMPREHENSIVE METABOLIC PANEL WITH GFR
ALT: 12 U/L (ref 0–35)
AST: 20 U/L (ref 0–37)
Albumin: 4.5 g/dL (ref 3.5–5.2)
Alkaline Phosphatase: 79 U/L (ref 39–117)
BUN: 19 mg/dL (ref 6–23)
CO2: 27 meq/L (ref 19–32)
Calcium: 9.5 mg/dL (ref 8.4–10.5)
Chloride: 102 meq/L (ref 96–112)
Creatinine, Ser: 0.65 mg/dL (ref 0.40–1.20)
GFR: 91.77 mL/min (ref >60.00)
Glucose, Bld: 81 mg/dL (ref 70–99)
Potassium: 4.6 meq/L (ref 3.5–5.1)
Sodium: 137 meq/L (ref 135–145)
Total Bilirubin: 0.5 mg/dL (ref 0.2–1.2)
Total Protein: 7.4 g/dL (ref 6.0–8.3)

## 2023-12-13 LAB — CBC WITH DIFFERENTIAL/PLATELET
Basophils Absolute: 0 K/uL (ref 0.0–0.1)
Basophils Relative: 0.8 % (ref 0.0–3.0)
Eosinophils Absolute: 0.3 K/uL (ref 0.0–0.7)
Eosinophils Relative: 5.7 % — ABNORMAL HIGH (ref 0.0–5.0)
HCT: 39.3 % (ref 36.0–46.0)
Hemoglobin: 13.2 g/dL (ref 12.0–15.0)
Lymphocytes Relative: 29.9 % (ref 12.0–46.0)
Lymphs Abs: 1.5 K/uL (ref 0.7–4.0)
MCHC: 33.6 g/dL (ref 30.0–36.0)
MCV: 98.3 fl (ref 78.0–100.0)
Monocytes Absolute: 0.5 K/uL (ref 0.1–1.0)
Monocytes Relative: 10 % (ref 3.0–12.0)
Neutro Abs: 2.6 K/uL (ref 1.4–7.7)
Neutrophils Relative %: 53.6 % (ref 43.0–77.0)
Platelets: 260 K/uL (ref 150.0–400.0)
RBC: 4 Mil/uL (ref 3.87–5.11)
RDW: 13.4 % (ref 11.5–15.5)
WBC: 4.9 K/uL (ref 4.0–10.5)

## 2023-12-13 LAB — HEMOGLOBIN A1C: Hgb A1c MFr Bld: 5.7 % (ref 4.6–6.5)

## 2023-12-13 LAB — VITAMIN D 25 HYDROXY (VIT D DEFICIENCY, FRACTURES): VITD: 42.18 ng/mL (ref 30.00–100.00)

## 2023-12-13 LAB — TSH: TSH: 1.88 u[IU]/mL (ref 0.35–5.50)

## 2023-12-13 MED ORDER — FLUTICASONE PROPIONATE 50 MCG/ACT NA SUSP: NASAL | 2 refills | Status: AC

## 2023-12-13 NOTE — Patient Instructions (Addendum)
 Be sure you are getting 1,200 mg of calcium  in your diet daily. I am checking your vitamin d  level today. Continue exercising.     Osteopenia  Osteopenia is a loss of thickness (density) inside the bones. Another name for osteopenia is low bone mass. Mild osteopenia is a normal part of aging. It is not a disease, and it does not cause symptoms. However, if you have osteopenia and continue to lose bone mass, you could develop a condition that causes the bones to become thin and break more easily (osteoporosis). Osteoporosis can cause you to lose some height, have back pain, and have a stooped posture. Although osteopenia is not a disease, making changes to your lifestyle and diet can help to prevent osteopenia from developing into osteoporosis. What are the causes? Osteopenia is caused by loss of calcium  in the bones. Bones are constantly changing. Old bone cells are continually being replaced with new bone cells. This process builds new bone. The mineral calcium  is needed to build new bone and maintain bone density. Bone density is usually highest around age 39. After that, most people's bodies cannot replace all the bone they have lost with new bone. What increases the risk? You are more likely to develop this condition if: You are older than age 26. You are a woman who went through menopause early. You have a long illness that keeps you in bed. You do not get enough exercise. You lack certain nutrients (malnutrition). You have an overactive thyroid  gland (hyperthyroidism). You use products that contain nicotine or tobacco, such as cigarettes, e-cigarettes and chewing tobacco, or you drink a lot of alcohol. You are taking medicines that weaken the bones, such as steroids. What are the signs or symptoms? This condition does not cause any symptoms. You may have a slightly higher risk for bone breaks (fractures), so getting fractures more easily than normal may be an indication of  osteopenia. How is this diagnosed? This condition may be diagnosed based on an X-ray exam that measures bone density (dual-energy X-ray absorptiometry, or DEXA). This test can measure bone density in your hips, spine, and wrists. Osteopenia has no symptoms, so this condition is usually diagnosed after a routine bone density screening test is done for osteoporosis. This routine screening is usually done for: Women who are age 37 or older. Men who are age 62 or older. If you have risk factors for osteopenia, you may have the screening test at an earlier age. How is this treated? Making dietary and lifestyle changes can lower your risk for osteoporosis. If you have severe osteopenia that is close to becoming osteoporosis, this condition can be treated with medicines and dietary supplements such as calcium  and vitamin D . These supplements help to rebuild bone density. Follow these instructions at home: Eating and drinking Eat a diet that is high in calcium  and vitamin D . Calcium  is found in dairy products, beans, salmon, and leafy green vegetables like spinach and broccoli. Look for foods that have vitamin D  and calcium  added to them (fortified foods), such as orange juice, cereal, and bread.  Lifestyle Do 30 minutes or more of a weight-bearing exercise every day, such as walking, jogging, or playing a sport. These types of exercises strengthen the bones. Do not use any products that contain nicotine or tobacco, such as cigarettes, e-cigarettes, and chewing tobacco. If you need help quitting, ask your health care provider. Do not drink alcohol if: Your health care provider tells you not to drink. You are  pregnant, may be pregnant, or are planning to become pregnant. If you drink alcohol: Limit how much you use to: 0-1 drink a day for women. 0-2 drinks a day for men. Be aware of how much alcohol is in your drink. In the [place] one drink equals one 12 oz bottle of beer (355 mL), one 5 oz glass of  wine (148 mL), or one 1 oz glass of hard liquor (44 mL). General instructions Take over-the-counter and prescription medicines only as told by your health care provider. These include vitamins and supplements. Take precautions at home to lower your risk of falling, such as: Keeping rooms well-lit and free of clutter, such as cords. Installing safety rails on stairs. Using rubber mats in the bathroom or other areas that are often wet or slippery. Keep all follow-up visits. This is important. Contact a health care provider if: You have not had a bone density screening for osteoporosis and you are: A woman who is age 53 or older. A man who is age 93 or older. You are a postmenopausal woman who has not had a bone density screening for osteoporosis. You are older than age 52 and you want to know if you should have bone density screening for osteoporosis. Summary Osteopenia is a loss of thickness (density) inside the bones. Another name for osteopenia is low bone mass. Osteopenia is not a disease, but it may increase your risk for a condition that causes the bones to become thin and break more easily (osteoporosis). You may be at risk for osteopenia if you are older than age 68 or if you are a woman who went through early menopause. Osteopenia does not cause any symptoms, but it can be diagnosed with a bone density screening test. Dietary and lifestyle changes are the first treatment for osteopenia. These may lower your risk for osteoporosis. This information is not intended to replace advice given to you by your health care provider. Make sure you discuss any questions you have with your health care provider. Document Revised: 12/04/2022 Document Reviewed: 12/04/2022 Elsevier Patient Education  2025 [employer].

## 2023-12-13 NOTE — Progress Notes (Signed)
 Complete physical exam  Patient: Monica Hood   DOB: December 26, 1955   68 y.o. Female  MRN: [id number]  Subjective:    Chief Complaint  Patient presents with   Annual Exam    fasting   She is here for a complete physical exam.   Discussed the use of AI scribe software for clinical note transcription with the patient, who gave verbal consent to proceed.  History of Present Illness Monica Hood is a 68 year old female who presents for her annual physical exam and follow-up on chronic health conditions.  Knee pain and meniscal injury - Probable meniscal tear with avoidance of surgical intervention - Received gel injections in June with initial improvement - Symptoms worsened after intense exercise, requiring weeks for recovery - Currently improved and cautious with weight-bearing exercises  Bone health and mineral supplementation - Bone density test in January showed early low bone loss - Experiences diarrhea with magnesium supplements  Dermatologic concerns - Skin tags on face and lower legs - Seeking new dermatology referral for further evaluation  Right shoulder discomfort - Intermittent sensation in right shoulder near the clavicle - No history of injury - Cautious with weight exercises  Ear dryness - Experiences ear dryness  General health and constitutional symptoms - Good energy levels - No chest pain, palpitations, or shortness of breath - No gastrointestinal or urinary symptoms - No numbness or tingling - Sleep is adequate - No suicidal ideation - Memory is good, but concerned due to family history of dementia    Health Maintenance  Topic Date Due   COVID-19 Vaccine (1) 12/29/2023*   Medicare Annual Wellness Visit  10/24/2024   Breast Cancer Screening  04/11/2025   Colon Cancer Screening  06/30/2025   DTaP/Tdap/Td vaccine (4 - Td or Tdap) 09/02/2031   Pneumococcal Vaccine for age over 18  Completed   Flu Shot  Completed   DEXA scan (bone density  measurement)  Completed   Hepatitis C Screening  Completed   Zoster (Shingles) Vaccine  Completed   HPV Vaccine  Aged Out   Meningitis B Vaccine  Aged Out  *Topic was postponed. The date shown is not the original due date.    Wears seatbelt always, uses sunscreen, smoke detectors in home and functioning, does not text while driving, feels safe in home environment.  Depression screening:    10/25/2023    8:53 AM 06/28/2023    8:13 AM 03/05/2023    7:59 AM  Depression screen PHQ 2/9  Decreased Interest 0 0 1  Down, Depressed, Hopeless 0 0 3  PHQ - 2 Score 0 0 4  Altered sleeping 0  2  Tired, decreased energy 1  3  Change in appetite 0  0  Feeling bad or failure about yourself  0  2  Trouble concentrating 0  0  Moving slowly or fidgety/restless 0  0  Suicidal thoughts 0  0  PHQ-9 Score 1  11  Difficult doing work/chores Not difficult at all     Anxiety Screening:    10/30/2022    8:00 AM  GAD 7 : Generalized Anxiety Score  Nervous, Anxious, on Edge 0  Control/stop worrying 0  Worry too much - different things 0  Trouble relaxing 0  Restless 0  Easily annoyed or irritable 0  Afraid - awful might happen 0  Total GAD 7 Score 0  Anxiety Difficulty Not difficult at all      Patient Care Team: Dalyce Renne  L, NP-C as PCP - General (Family Medicine) [place], [place], OD [facility])   Outpatient Medications Prior to Visit  Medication Sig   atorvastatin  (LIPITOR) 20 MG tablet Take 1 tablet (20 mg total) by mouth daily.   bimatoprost (LUMIGAN) 0.01 % SOLN 1 drop at bedtime.   chlorhexidine (PERIDEX) 0.12 % solution    DULoxetine  (CYMBALTA ) 30 MG capsule TAKE 1 CAPSULE(30 MG) BY MOUTH DAILY   meloxicam  (MOBIC ) 7.5 MG tablet TAKE 1 TABLET BY MOUTH EVERY DAY   [DISCONTINUED] fluticasone  (FLONASE ) 50 MCG/ACT nasal spray SPRAY 2 SPRAYS INTO EACH NOSTRIL EVERY DAY   No facility-administered medications prior to visit.    Review of Systems  Constitutional:  Negative for  chills, fever, malaise/fatigue and weight loss.  HENT:  Negative for congestion, ear pain, sinus pain and sore throat.   Eyes:  Negative for blurred vision, double vision and pain.  Respiratory:  Negative for cough, shortness of breath and wheezing.   Cardiovascular:  Negative for chest pain, palpitations and leg swelling.  Gastrointestinal:  Negative for abdominal pain, constipation, diarrhea, nausea and vomiting.  Genitourinary:  Negative for dysuria, frequency and urgency.  Musculoskeletal:  Positive for joint pain. Negative for back pain and myalgias.  Skin:  Negative for rash.  Neurological:  Negative for dizziness, tingling, focal weakness and headaches.  Psychiatric/Behavioral:  Negative for depression and suicidal ideas. The patient is not nervous/anxious and does not have insomnia.        Objective:    BP 112/80   Pulse 80   Temp 97.6 F (36.4 C) (Temporal)   Ht 5' 4.5 (1.638 m)   Wt 146 lb (66.2 kg)   SpO2 95%   BMI 24.67 kg/m  BP Readings from Last 3 Encounters:  12/13/23 112/80  06/28/23 120/84  04/05/23 128/84   Wt Readings from Last 3 Encounters:  12/13/23 146 lb (66.2 kg)  10/25/23 150 lb (68 kg)  06/28/23 149 lb (67.6 kg)    Physical Exam Constitutional:      General: She is not in acute distress.    Appearance: She is not ill-appearing.  HENT:     Right Ear: Tympanic membrane, ear canal and external ear normal.     Left Ear: Tympanic membrane, ear canal and external ear normal.     Nose: Nose normal.     Mouth/Throat:     Mouth: Mucous membranes are moist.     Pharynx: Oropharynx is clear.  Eyes:     Extraocular Movements: Extraocular movements intact.     Conjunctiva/sclera: Conjunctivae normal.     Pupils: Pupils are equal, round, and reactive to light.  Neck:     Thyroid : No thyroid  mass, thyromegaly or thyroid  tenderness.  Cardiovascular:     Rate and Rhythm: Normal rate and regular rhythm.     Pulses: Normal pulses.     Heart sounds:  Normal heart sounds.  Pulmonary:     Effort: Pulmonary effort is normal.     Breath sounds: Normal breath sounds.  Abdominal:     General: Bowel sounds are normal.     Palpations: Abdomen is soft.     Tenderness: There is no abdominal tenderness. There is no right CVA tenderness, left CVA tenderness, guarding or rebound.  Musculoskeletal:        General: Normal range of motion.     Cervical back: Normal range of motion and neck supple. No tenderness.     Right lower leg: No edema.  Left lower leg: No edema.  Lymphadenopathy:     Cervical: No cervical adenopathy.  Skin:    General: Skin is warm and dry.     Findings: No lesion or rash.     Comments: Hyperpigmentation of bilateral lower extremities   Neurological:     General: No focal deficit present.     Mental Status: She is alert and oriented to person, place, and time.     Cranial Nerves: No cranial nerve deficit.     Sensory: No sensory deficit.     Motor: No weakness.     Gait: Gait normal.  Psychiatric:        Mood and Affect: Mood normal.        Behavior: Behavior normal.        Thought Content: Thought content normal.      Results for orders placed or performed in visit on 12/13/23  VITAMIN D  25 Hydroxy (Vit-D Deficiency, Fractures)  Result Value Ref Range   VITD 42.18 30.00 - 100.00 ng/mL  Hemoglobin A1c  Result Value Ref Range   Hgb A1c MFr Bld 5.7 4.6 - 6.5 %  TSH  Result Value Ref Range   TSH 1.88 0.35 - 5.50 uIU/mL  Lipid panel  Result Value Ref Range   Cholesterol 177 0 - 200 mg/dL   Triglycerides 51.9 0.0 - 149.0 mg/dL   HDL 39.59 >60.99 mg/dL   VLDL 9.6 0.0 - 59.9 mg/dL   LDL Cholesterol 892 (H) 0 - 99 mg/dL   Total CHOL/HDL Ratio 3    NonHDL 116.97   Comprehensive metabolic panel with GFR  Result Value Ref Range   Sodium 137 135 - 145 mEq/L   Potassium 4.6 3.5 - 5.1 mEq/L   Chloride 102 96 - 112 mEq/L   CO2 27 19 - 32 mEq/L   Glucose, Bld 81 70 - 99 mg/dL   BUN 19 6 - 23 mg/dL    Creatinine, Ser 9.34 0.40 - 1.20 mg/dL   Total Bilirubin 0.5 0.2 - 1.2 mg/dL   Alkaline Phosphatase 79 39 - 117 U/L   AST 20 0 - 37 U/L   ALT 12 0 - 35 U/L   Total Protein 7.4 6.0 - 8.3 g/dL   Albumin 4.5 3.5 - 5.2 g/dL   GFR 08.22 >39.99 mL/min   Calcium  9.5 8.4 - 10.5 mg/dL  CBC with Differential/Platelet  Result Value Ref Range   WBC 4.9 4.0 - 10.5 K/uL   RBC 4.00 3.87 - 5.11 Mil/uL   Hemoglobin 13.2 12.0 - 15.0 g/dL   HCT 60.6 63.9 - 53.9 %   MCV 98.3 78.0 - 100.0 fl   MCHC 33.6 30.0 - 36.0 g/dL   RDW 86.5 88.4 - 84.4 %   Platelets 260.0 150.0 - 400.0 K/uL   Neutrophils Relative % 53.6 43.0 - 77.0 %   Lymphocytes Relative 29.9 12.0 - 46.0 %   Monocytes Relative 10.0 3.0 - 12.0 %   Eosinophils Relative 5.7 (H) 0.0 - 5.0 %   Basophils Relative 0.8 0.0 - 3.0 %   Neutro Abs 2.6 1.4 - 7.7 K/uL   Lymphs Abs 1.5 0.7 - 4.0 K/uL   Monocytes Absolute 0.5 0.1 - 1.0 K/uL   Eosinophils Absolute 0.3 0.0 - 0.7 K/uL   Basophils Absolute 0.0 0.0 - 0.1 K/uL      Assessment & Plan:    Routine Health Maintenance and Physical Exam Problem List Items Addressed This Visit     Allergic rhinitis  Relevant Medications   fluticasone  (FLONASE ) 50 MCG/ACT nasal spray   Hyperlipidemia   Relevant Orders   Lipid panel (Completed)   Osteoarthritis of both knees   Other Visit Diagnoses       Encounter for general adult medical examination with abnormal findings    -  Primary   Relevant Orders   CBC with Differential/Platelet (Completed)   Comprehensive metabolic panel with GFR (Completed)   EKG 12-Lead     Need for influenza vaccination       Relevant Orders   Flu vaccine HIGH DOSE PF(Fluzone Trivalent) (Completed)     Family history of diabetes mellitus (DM)       Relevant Orders   Hemoglobin A1c (Completed)     Osteopenia determined by x-ray       Relevant Orders   VITAMIN D  25 Hydroxy (Vit-D Deficiency, Fractures) (Completed)     Hyperpigmentation of skin       Relevant Orders    Ambulatory referral to Dermatology     Atypical nevus of face       Relevant Orders   Ambulatory referral to Dermatology     Screening for thyroid  disorder       Relevant Orders   TSH (Completed)     Vitamin D  deficiency       Relevant Orders   VITAMIN D  25 Hydroxy (Vit-D Deficiency, Fractures) (Completed)       Assessment and Plan Assessment & Plan Adult Wellness Visit Annual physical exam conducted. Up to date on mammogram, colonoscopy, and bone density test. No new family history concerns. Former smoker, does not qualify for lung cancer CT screening. Alcohol intake is being reduced. No suicidal thoughts, memory issues, chest pain, palpitations, shortness of breath, urinary symptoms, or numbness/tingling. Energy levels are good. Sleeping well. Ear dryness noted. - Perform EKG to update records.   EKG shows NSR, rate 72. No ischemic pattern    - Advise on calcium  intake: 1200 mg through diet or low dose calcium  supplement (300 mg) - Recommend vitamin D  supplementation, avoid magnesium due to diarrhea - Advise on ear dryness: use Aquaphor for moisture or hydrocortisone if itchy - Refer to dermatologist for skin issues  Bilateral primary osteoarthritis of knee Received gel shots in knees in June, resulting in improvement. Overexertion in exercise class led to temporary setback, but condition has improved. - Advise gradual increase in physical activity to avoid overexertion  Osteopenia Recent bone density test shows early low bone loss, classified as early osteopenia. Emphasized the importance of calcium  and vitamin D  intake to support bone health. - Ensure adequate calcium  intake: 1200 mg through diet or low dose supplement - Recommend vitamin D  supplementation and exercise      Return in about 1 year (around 12/12/2024).     Boby Mackintosh, NP-C

## 2023-12-18 ENCOUNTER — Ambulatory Visit: Payer: Self-pay | Admitting: Family Medicine

## 2023-12-23 ENCOUNTER — Ambulatory Visit (INDEPENDENT_AMBULATORY_CARE_PROVIDER_SITE_OTHER): Admitting: Psychiatry

## 2023-12-23 DIAGNOSIS — F331 Major depressive disorder, recurrent, moderate: Secondary | ICD-10-CM

## 2023-12-23 NOTE — Progress Notes (Signed)
 [facility] Counselor/Therapist Progress Note  Patient ID: Monica Hood, MRN: [id number],    Date: 12/23/2023  Time Spent: 53 minutes   Treatment Type: Individual Therapy  Reported Symptoms: anxiety, use of alcohol continues but I did cut back some recently, trying to change unhealthy habits (drinking less and more exercise), depression and it can come and go, improving attitude about working to cut back and eventually stop her alcohol consumption whereas she said in last session she didn't want to quit. (Works better with structure, which we are keeping in mind as she works on her goals). Encouraged her in strengthening her self-discipline, with which she agrees. Very disturbed by political scene in the [place]   Mental Status Exam:  Appearance:   Casual and Neat     Behavior:  Appropriate, Sharing, and Motivated  Motor:  Normal  Speech/Language:   Clear and Coherent  Affect:  Depressed and anxiety  Mood:  anxious and depressed  Thought process:  goal directed  Thought content:    Rumination  Sensory/Perceptual disturbances:    WNL  Orientation:  oriented to person, place, time/date, situation, day of week, month of year, year, and stated date of Sept. 22, 2025  Attention:  Good  Concentration:  Good  Memory:  WNL  Fund of knowledge:   Good  Insight:    Good and Fair  Judgment:   Good and Fair, Improving  Impulse Control:  Good   Risk Assessment: Danger to Self:  No Self-injurious Behavior: No Danger to Others: No Duty to Warn:no Physical Aggression / Violence:No  Access to Firearms a concern: No  Gang Involvement:No   Subjective:  Patient reporting today that she has actually done some better in being more focused on her needs and goals and changes. States her depression does seem some better when she comes and talks through issues that support the depression. Realizing her prior lack of self-care. Also seems to understand how her alcohol is a depressant.  Sometimes I am like an all or nothing  person.  Has been in touch with Dr's office about being healthier and eating healthier. Low self-esteem, not been good with goals previously. Trying to get out of house more. Did connect with a few others at church, and a couple neighbors. Does tend to be self-judgemental and be-rating at times and mentioned this to her in session today, which she seemed surprised at initially and later able to see this also when we discussed several examples of this. Does note that she feels some better about herself when I'm at least trying to make better choices and we totally agreed on that. Sleep is good. Working to exercise a little more. Wants to be more consistent in her positive thoughts, behaviors and discussed how she can build upon current progress. Attitude towards alcohol better today and recognizes what helps and what hurts. Has agreed to some specific limitations in her alcohol starting this week, and has actually already begun this weekend. Good insight in some ways and on some issues.   Interventions: Cognitive Behavioral Therapy, Solution-Oriented/Positive Psychology, and Ego-Supportive 1.  Confront and work on her alcohol abuse. Identify life conflicts and/or situations including from the past and the present, that support current symptomology of anxiety and depression.  2.  Develop behavioral and cognitive strategies to reduce or eliminate excessive anxiety, depression.  Identify, challenge, and replace negative self-talk with more positive, realistic, and empowering self-talk. 3.  Patient will work on Engineer, mining based,  positive cognitive messages that can help her improve her mood and outlook while also working on building self-confidence.   Diagnosis:   ICD-10-CM   1. Major depressive disorder, recurrent episode, moderate (HCC)  F33.1       Plan: Patient showing some encouraging efforts in working on her goals as noted above in subjective  section. Motivate more. Showing some progress in her decreased alcohol consumption, in her self care, walking, and some in her self-esteem and envisioning a better future. Structure and routine are very helpful for patient. Using book, Grounded: Finding peace in an anxious world , which patient is finding helpful in her struggles.  Participates in AA and encouraged her to continue.  Is to see [facility], PA-C, within 2 weeks for med evaluation.  Patient continues to deny any thoughts of self-harm.  Will see her again for therapy within approximately 2-3 weeks.  In the meantime, is to continue working on goal related homework and writing which she brings in with her for appointments.   Goal review and progress/challenges noted with patient.  Return for next appointment within 2 weeks.   Barnie Bunde, LCSW

## 2023-12-28 ENCOUNTER — Other Ambulatory Visit: Payer: Self-pay | Admitting: Family Medicine

## 2023-12-28 DIAGNOSIS — M170 Bilateral primary osteoarthritis of knee: Secondary | ICD-10-CM

## 2023-12-28 DIAGNOSIS — E785 Hyperlipidemia, unspecified: Secondary | ICD-10-CM

## 2023-12-28 DIAGNOSIS — F325 Major depressive disorder, single episode, in full remission: Secondary | ICD-10-CM

## 2024-01-08 ENCOUNTER — Ambulatory Visit: Admitting: Psychiatry

## 2024-01-08 DIAGNOSIS — F331 Major depressive disorder, recurrent, moderate: Secondary | ICD-10-CM | POA: Diagnosis not present

## 2024-01-08 NOTE — Progress Notes (Signed)
 [facility] Counselor/Therapist Progress Note  Patient ID: Monica Hood, MRN: [id number],    Date: 01/08/2024  Time Spent: 55 minutes   Treatment Type: Individual Therapy  Reported Symptoms: anxiety decreased and it comes in waves, working on cutting back her alcohol and sugar, attitude is good and talks further about this, strengthening self-discipline, and disturbed by political activity in the [place]     Mental Status Exam:  Appearance:   Casual     Behavior:  Appropriate, Sharing, and Motivated  Motor:  Normal  Speech/Language:   Clear and Coherent  Affect:  anxiety  Mood:  anxious  Thought process:  goal directed  Thought content:    Rumination  Sensory/Perceptual disturbances:    WNL  Orientation:  oriented to person, place, time/date, situation, day of week, month of year, year, and stated date of Oct. 8, 2025  Attention:  Good  Concentration:  Good  Memory:  WNL  Fund of knowledge:   Good  Insight:    Good and Fair  Judgment:   Good  Impulse Control:  Good   Risk Assessment: Danger to Self:  No Self-injurious Behavior: No Danger to Others: No Duty to Warn:no Physical Aggression / Violence:No  Access to Firearms a concern: No  Gang Involvement:No   Subjective:    Patient today reports continued focus on her goals and changes she is working on. Continues trying to get out more, be more involved in activities/groups at her church, try to increase her self-care, mindful of her trying to decrease alcohol consumption and is doing so gradually and staying with that so far, eating healthier, getting out of house more, connecting with others at church, reducing self-judging, working on her self esteem, and being less judgmental. Working to be more consistent in her positive thinking and positive behavior. Sleep is very good. Likes the accountablity in therapy and feels she is making progress. Motivated. Good awareness and seems to be quite honest with herself  especially in making some changes she is wanting to make but are not easy. More aware of her goals and progress/lack of progress, but on the more positive side currently.    Interventions: Cognitive Behavioral Therapy, Solution-Oriented/Positive Psychology, and Ego-Supportive 1.  Confront and work on her alcohol abuse. Identify life conflicts and/or situations including from the past and the present, that support current symptomology of anxiety and depression.  2.  Develop behavioral and cognitive strategies to reduce or eliminate excessive anxiety, depression.  Identify, challenge, and replace negative self-talk with more positive, realistic, and empowering self-talk. 3.  Patient will work on developing reality based, positive cognitive messages that can help her improve her mood and outlook while also working on Environmental consultant.   Diagnosis:   ICD-10-CM   1. Major depressive disorder, recurrent episode, moderate (HCC)  F33.1      Plan:  Patient today showing continued work on her goals for treatment, showing good motivation in her clutter at home working to let go of things she doesn't need, decreasing her use of alcohol, paying attention to her self-care, walking more for exercise and being more active, and trying to keep her mindset on progress she is making. Sticking with her routines and structure are very helpful for patient especially when I'm depressed, anxious, or struggling. Not using AA, and is doing fine without it, because of her motivation and support amongst neighbors and peers.  Continues using a book about staying grounded and finding peace in anxious world  and that is very helpful for patient. Got a pedicure, as a treat for herself. Continues on her meds through Regina Mozingo, DNP.  Goal review and progress/challenges noted with patient.  Return for next appointment within 2 weeks.   Barnie Bunde, LCSW

## 2024-01-10 DIAGNOSIS — B351 Tinea unguium: Secondary | ICD-10-CM | POA: Diagnosis not present

## 2024-01-10 DIAGNOSIS — I872 Venous insufficiency (chronic) (peripheral): Secondary | ICD-10-CM | POA: Diagnosis not present

## 2024-01-10 DIAGNOSIS — B078 Other viral warts: Secondary | ICD-10-CM | POA: Diagnosis not present

## 2024-01-14 ENCOUNTER — Encounter: Payer: Self-pay | Admitting: Adult Health

## 2024-01-14 ENCOUNTER — Ambulatory Visit: Admitting: Adult Health

## 2024-01-14 VITALS — BP 126/78 | HR 84 | Ht 64.0 in | Wt 141.0 lb

## 2024-01-14 DIAGNOSIS — F331 Major depressive disorder, recurrent, moderate: Secondary | ICD-10-CM | POA: Diagnosis not present

## 2024-01-14 NOTE — Progress Notes (Signed)
 [facility] MD/PA/NP Initial Note  01/14/2024 9:44 AM Monica Hood  MRN:  [id number]  Chief Complaint:   HPI:   Patient seen today for initial psychiatric evaluation.   Referred by Marval Bunde.  Describes mood today as ok. Pleasant. Denies tearfulness. Mood symptoms - denies depression - not right now. Reports anxiety at times - state of the country. Reports sensitivity to noises. Reports irritability at times. Reports stable interest and motivation. Denies panic attacks. Reports some worry, rumination and over thinking - just when I'm depressed. Denies obsessive thoughts or acts. Reports mood as stable. Stating I feel like I'm doing real good right now. Would like to explore medication options, but  feels like the Cymbalta  mostly works well for her. Taking medications as prescribed.  Enjoys some usual interests and activities. Single. Lives alone with 3 cats. Spending time with family. Appetite adequate. Weight stable. Sleeps well most nights. Averages 8 hours. Focus and concentration stable. Completing tasks. Managing aspects of household. Works [employer] at the [employer] Denies SI or HI.  Denies AH or VH. Denies self harm. Denies substance use. Reports alcohol use - 2 to 3 glasses of wine a day trying to cut back. Reports tobacco use - 5 cigarettes a day.  Previous medication trials:   Prozac  Lexapro Cymbalta   Visit Diagnosis:    ICD-10-CM   1. Major depressive disorder, recurrent episode, moderate (HCC)  F33.1       Past Psychiatric History: Denies psychiatric hospitalization.   Past Medical History:  Past Medical History:  Diagnosis Date   Arthritis    Depression    Hyperlipidemia    past hx of but not now    Smoker    1/2 pack per week-former smojker 06-15-15    Past Surgical History:  Procedure Laterality Date   COLONOSCOPY     last colon 17 years ago normal per pt   DENTAL SURGERY      Family Psychiatric History: Family history  of mental illness - brother alcoholic.   Family History:  Family History  Problem Relation Age of Onset   Parkinsonism Mother 32       DIED W PARKINSON'S   Hypertension Father    Heart disease Father    Colon polyps Father    Dementia Father    Diabetes Brother    Colon cancer Neg Hx    Esophageal cancer Neg Hx    Rectal cancer Neg Hx    Stomach cancer Neg Hx    Pancreatic cancer Neg Hx    Breast cancer Neg Hx     Social History:  Social History   Socioeconomic History   Marital status: Single    Spouse name: Not on file   Number of children: Not on file   Years of education: Not on file   Highest education level: Not on file  Occupational History   Not on file  Tobacco Use   Smoking status: Former    Current packs/day: 0.00    Types: Cigarettes    Quit date: 2008    Years since quitting: 17.7   Smokeless tobacco: Never   Tobacco comments:    4 cig/day from age 75 until 2008.  Vaping Use   Vaping status: Never Used  Substance and Sexual Activity   Alcohol use: Yes    Alcohol/week: 6.0 standard drinks of alcohol    Types: 6 Glasses of wine per week    Comment: every other day   Drug  use: No   Sexual activity: Yes    Comment: 1st intercourse 24 yo-5 partners  Other Topics Concern   Not on file  Social History Narrative   Are you right handed or left handed? right   Are you currently employed ? 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 yes   Who lives with you?    What type of home do you live in: 1 story or 2 story? two   Caffiene 1 - 2 cups    Social Drivers of Corporate investment banker Strain: Low Risk  (10/25/2023)   Overall Financial Resource Strain (CARDIA)    Difficulty of Paying Living Expenses: Not hard at all  Food Insecurity: No Food Insecurity (10/25/2023)   Hunger Vital Sign    Worried About Running Out of Food in the Last Year: Never true    Ran Out of Food in the Last Year: Never true  Transportation  Needs: No Transportation Needs (10/25/2023)   PRAPARE - Administrator, Civil Service (Medical): No    Lack of Transportation (Non-Medical): No  Physical Activity: Insufficiently Active (10/25/2023)   Exercise Vital Sign    Days of Exercise per Week: 3 days    Minutes of Exercise per Session: 20 min  Stress: No Stress Concern Present (10/25/2023)   [employer] of Occupational Health - Occupational Stress Questionnaire    Feeling of Stress: Only a little  Social Connections: Socially Isolated (10/25/2023)   Social Connection and Isolation Panel    Frequency of Communication with Friends and Family: Three times a week    Frequency of Social Gatherings with Friends and Family: Once a week    Attends Religious Services: Never    Database administrator or Organizations: No    Attends Engineer, structural: Never    Marital Status: Never married    Allergies: No Known Allergies  Metabolic Disorder Labs: Lab Results  Component Value Date   HGBA1C 5.7 12/13/2023   No results found for: PROLACTIN Lab Results  Component Value Date   CHOL 177 12/13/2023   TRIG 48.0 12/13/2023   HDL 60.40 12/13/2023   CHOLHDL 3 12/13/2023   VLDL 9.6 12/13/2023   LDLCALC 107 (H) 12/13/2023   LDLCALC 107 (H) 03/07/2023   Lab Results  Component Value Date   TSH 1.88 12/13/2023   TSH 1.61 03/07/2023    Therapeutic Level Labs: No results found for: LITHIUM No results found for: VALPROATE No results found for: CBMZ  Current Medications: Current Outpatient Medications  Medication Sig Dispense Refill   atorvastatin  (LIPITOR) 20 MG tablet Take 1 tablet (20 mg total) by mouth daily. 90 tablet 1   bimatoprost (LUMIGAN) 0.01 % SOLN 1 drop at bedtime.     chlorhexidine (PERIDEX) 0.12 % solution      DULoxetine  (CYMBALTA ) 30 MG capsule TAKE 1 CAPSULE(30 MG) BY MOUTH DAILY 90 capsule 2   fluticasone  (FLONASE ) 50 MCG/ACT nasal spray SPRAY 2 SPRAYS INTO EACH NOSTRIL EVERY DAY  48 mL 2   meloxicam  (MOBIC ) 7.5 MG tablet TAKE 1 TABLET BY MOUTH EVERY DAY 90 tablet 3   No current facility-administered medications for this visit.    Medication Side Effects: none  Orders placed this visit:  No orders of the defined types were placed in this encounter.   Psychiatric Specialty Exam:  Review of Systems  Musculoskeletal:  Negative for gait problem.  Neurological:  Negative for tremors.  Psychiatric/Behavioral:         Please refer to HPI    Blood pressure 126/78, pulse 84, height 5' 4 (1.626 m), weight 141 lb (64 kg).Body mass index is 24.2 kg/m.  General Appearance: Casual and Neat  Eye Contact:  Good  Speech:  Clear and Coherent and Normal Rate  Volume:  Normal  Mood:  Euthymic  Affect:  Appropriate and Congruent  Thought Process:  Coherent and Descriptions of Associations: Intact  Orientation:  Full (Time, Place, and Person)  Thought Content: Logical   Suicidal Thoughts:  No  Homicidal Thoughts:  No  Memory:  WNL  Judgement:  Good  Insight:  Good  Psychomotor Activity:  Normal  Concentration:  Concentration: Good and Attention Span: Good  Recall:  Good  Fund of Knowledge: Good  Language: Good  Assets:  Communication Skills Desire for Improvement Financial Resources/Insurance Housing Intimacy Leisure Time Physical Health Resilience Social Support Talents/Skills Transportation Vocational/Educational  ADL's:  Intact  Cognition: WNL  Prognosis:  Good   Screenings:  AUDIT    Flowsheet Row Clinical Support from 10/25/2023 in [facility]  Alcohol Use Disorder Identification Test Final Score (AUDIT) 3   GAD-7    Flowsheet Row Office Visit from 10/30/2022 in [facility] [place]  Total GAD-7 Score 0   PHQ2-9    Flowsheet Row Clinical Support from 10/25/2023 in [facility] [place] Office Visit from 06/28/2023 in [facility] [place] Office Visit from 03/05/2023 in [facility] [place] Office Visit from 10/30/2022 in [facility] [place] Office Visit from 03/30/2022 in [facility] [place]  PHQ-2 Total Score 0 0 4 0 0  PHQ-9 Total Score 1 -- 11 0 0    Receiving Psychotherapy: Yes   Treatment Plan/Recommendations:   Plan:  PDMP reviewed  Continue Cymbalta  30mg  daily  15 minutes spent dedicated to the care of this patient on the date of this encounter to include pre-visit review of records, ordering of medication, post visit documentation, and face-to-face time with the patient discussing depression, anxiety, insomnia and obsessional thoughts. Discussed continuing current medication regimen. Plans to follow up with PCP.  RTC as needed  Patient advised to contact office with any questions, adverse effects, or acute worsening in signs and symptoms.    Monica Croucher N Jailynne Opperman, NP

## 2024-01-23 ENCOUNTER — Ambulatory Visit: Admitting: Psychiatry

## 2024-02-06 ENCOUNTER — Ambulatory Visit (INDEPENDENT_AMBULATORY_CARE_PROVIDER_SITE_OTHER): Admitting: Psychiatry

## 2024-02-20 ENCOUNTER — Ambulatory Visit: Admitting: Psychiatry

## 2024-03-20 ENCOUNTER — Other Ambulatory Visit: Payer: Self-pay | Admitting: Family Medicine

## 2024-03-20 DIAGNOSIS — E7800 Pure hypercholesterolemia, unspecified: Secondary | ICD-10-CM

## 2024-04-10 ENCOUNTER — Other Ambulatory Visit: Payer: Self-pay | Admitting: Obstetrics and Gynecology

## 2024-04-10 DIAGNOSIS — Z1231 Encounter for screening mammogram for malignant neoplasm of breast: Secondary | ICD-10-CM

## 2024-04-24 ENCOUNTER — Ambulatory Visit

## 2024-05-01 ENCOUNTER — Ambulatory Visit
Admission: RE | Admit: 2024-05-01 | Discharge: 2024-05-01 | Disposition: A | Source: Ambulatory Visit | Attending: Obstetrics and Gynecology

## 2024-05-01 DIAGNOSIS — Z1231 Encounter for screening mammogram for malignant neoplasm of breast: Secondary | ICD-10-CM

## 2024-05-05 ENCOUNTER — Ambulatory Visit: Payer: Self-pay | Admitting: Obstetrics and Gynecology

## 2024-05-14 ENCOUNTER — Encounter: Admitting: Obstetrics and Gynecology

## 2024-07-20 ENCOUNTER — Ambulatory Visit: Admitting: Physician Assistant

## 2024-10-30 ENCOUNTER — Ambulatory Visit

## 2024-12-18 ENCOUNTER — Encounter: Admitting: Family Medicine
